# Patient Record
Sex: Female | Born: 1985 | Race: Black or African American | Hispanic: No | Marital: Married | Smoking: Never smoker
Health system: Southern US, Community
[De-identification: ages and names within clinical notes are randomized; demographics above are authoritative.]

## PROBLEM LIST (undated history)

## (undated) DIAGNOSIS — R87629 Unspecified abnormal cytological findings in specimens from vagina: Secondary | ICD-10-CM

## (undated) DIAGNOSIS — Z8759 Personal history of other complications of pregnancy, childbirth and the puerperium: Secondary | ICD-10-CM

## (undated) DIAGNOSIS — Z789 Other specified health status: Secondary | ICD-10-CM

## (undated) DIAGNOSIS — O139 Gestational [pregnancy-induced] hypertension without significant proteinuria, unspecified trimester: Secondary | ICD-10-CM

## (undated) DIAGNOSIS — I10 Essential (primary) hypertension: Secondary | ICD-10-CM

## (undated) HISTORY — DX: Essential (primary) hypertension: I10

## (undated) HISTORY — PX: TONSILLECTOMY: SUR1361

## (undated) HISTORY — DX: Personal history of other complications of pregnancy, childbirth and the puerperium: Z87.59

## (undated) HISTORY — DX: Gestational (pregnancy-induced) hypertension without significant proteinuria, unspecified trimester: O13.9

---
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
## 2014-03-23 DIAGNOSIS — Z720 Tobacco use: Secondary | ICD-10-CM | POA: Diagnosis present

## 2018-09-28 ENCOUNTER — Other Ambulatory Visit (HOSPITAL_COMMUNITY): Payer: Self-pay | Admitting: Nurse Practitioner

## 2018-09-28 DIAGNOSIS — Z3A21 21 weeks gestation of pregnancy: Secondary | ICD-10-CM

## 2018-09-28 DIAGNOSIS — Z3689 Encounter for other specified antenatal screening: Secondary | ICD-10-CM

## 2018-09-28 DIAGNOSIS — Z68.41 Body mass index (BMI) pediatric, 5th percentile to less than 85th percentile for age: Secondary | ICD-10-CM

## 2018-09-28 LAB — OB RESULTS CONSOLE GC/CHLAMYDIA
CHLAMYDIA, DNA PROBE: NEGATIVE
Gonorrhea: NEGATIVE

## 2018-09-28 LAB — OB RESULTS CONSOLE ABO/RH: RH Type: POSITIVE

## 2018-09-28 LAB — OB RESULTS CONSOLE RPR: RPR: NONREACTIVE

## 2018-09-28 LAB — OB RESULTS CONSOLE HGB/HCT, BLOOD
HEMATOCRIT: 37 (ref 29–41)
HEMOGLOBIN: 12

## 2018-09-28 LAB — AMB REFERRAL TO OB-GYN
A1C: 5.4
Pap: NEGATIVE
Urine Culture, OB: NEGATIVE

## 2018-09-28 LAB — OB RESULTS CONSOLE VARICELLA ZOSTER ANTIBODY, IGG: Varicella: NON-IMMUNE/NOT IMMUNE

## 2018-09-28 LAB — OB RESULTS CONSOLE PLATELET COUNT: PLATELETS: 211

## 2018-09-28 LAB — OB RESULTS CONSOLE ANTIBODY SCREEN: Antibody Screen: NEGATIVE

## 2018-09-28 LAB — OB RESULTS CONSOLE HEPATITIS B SURFACE ANTIGEN: Hepatitis B Surface Ag: NEGATIVE

## 2018-09-29 ENCOUNTER — Encounter (HOSPITAL_COMMUNITY): Payer: Self-pay | Admitting: *Deleted

## 2018-10-03 ENCOUNTER — Encounter (HOSPITAL_COMMUNITY): Payer: Self-pay

## 2018-10-03 ENCOUNTER — Other Ambulatory Visit: Payer: Self-pay

## 2018-10-03 ENCOUNTER — Ambulatory Visit (HOSPITAL_COMMUNITY)
Admission: RE | Admit: 2018-10-03 | Discharge: 2018-10-03 | Disposition: A | Discharge status: Home / Self Care | Payer: Medicaid Other | Source: Ambulatory Visit | Attending: Nurse Practitioner | Admitting: Nurse Practitioner

## 2018-10-03 DIAGNOSIS — O0932 Supervision of pregnancy with insufficient antenatal care, second trimester: Secondary | ICD-10-CM

## 2018-10-03 DIAGNOSIS — Z36 Encounter for antenatal screening for chromosomal anomalies: Secondary | ICD-10-CM | POA: Insufficient documentation

## 2018-10-03 DIAGNOSIS — Z68.41 Body mass index (BMI) pediatric, 5th percentile to less than 85th percentile for age: Secondary | ICD-10-CM

## 2018-10-03 DIAGNOSIS — O99332 Smoking (tobacco) complicating pregnancy, second trimester: Secondary | ICD-10-CM | POA: Diagnosis not present

## 2018-10-03 DIAGNOSIS — O09213 Supervision of pregnancy with history of pre-term labor, third trimester: Secondary | ICD-10-CM

## 2018-10-03 DIAGNOSIS — Z363 Encounter for antenatal screening for malformations: Secondary | ICD-10-CM

## 2018-10-03 DIAGNOSIS — Z3A21 21 weeks gestation of pregnancy: Secondary | ICD-10-CM | POA: Insufficient documentation

## 2018-10-03 DIAGNOSIS — Z3689 Encounter for other specified antenatal screening: Secondary | ICD-10-CM

## 2018-10-03 HISTORY — DX: Other specified health status: Z78.9

## 2018-10-04 ENCOUNTER — Other Ambulatory Visit (HOSPITAL_COMMUNITY): Payer: Self-pay | Admitting: *Deleted

## 2018-10-04 DIAGNOSIS — O132 Gestational [pregnancy-induced] hypertension without significant proteinuria, second trimester: Secondary | ICD-10-CM

## 2018-10-06 ENCOUNTER — Encounter: Payer: Self-pay | Admitting: *Deleted

## 2018-10-10 ENCOUNTER — Encounter: Payer: Self-pay | Admitting: Obstetrics and Gynecology

## 2018-10-10 NOTE — BH Specialist Note (Deleted)
Integrated Behavioral Health Initial Visit  MRN: [id number] Name: Jill Mullen  Number of Integrated Behavioral Health Clinician visits:: 1/6 Session Start time: ***  Session End time: *** Total time: {IBH Total Time:21014050}  Type of Service: Integrated Behavioral Health- Individual/Family Interpretor:No. Interpretor Name and Language: n/a   Warm Hand Off Completed.       SUBJECTIVE: Jill Mullen is a 32 y.o. female accompanied by {CHL AMB ACCOMPANIED WU:9811914782} Patient was referred by Catalina Antigua, MD for Initial OB introduction to integrated behavioral health services . Patient reports the following symptoms/concerns: *** Duration of problem: ***; Severity of problem: {Mild/Moderate/Severe:20260}  OBJECTIVE: Mood: {BHH MOOD:22306} and Affect: {BHH AFFECT:22307} Risk of harm to self or others: {CHL AMB BH Suicide Current Mental Status:21022748}  LIFE CONTEXT: Family and Social: *** School/Work: *** Self-Care: *** Life Changes: Current pregnancy ***  GOALS ADDRESSED: Patient will: 1. Reduce symptoms of: {IBH Symptoms:21014056} 2. Increase knowledge and/or ability of: {IBH Patient Tools:21014057}  3. Demonstrate ability to: {IBH Goals:21014053}  INTERVENTIONS: Interventions utilized: {IBH Interventions:21014054}  Standardized Assessments completed: GAD-7 and PHQ 9  ASSESSMENT: Patient currently experiencing Supervision of *** pregnancy ***.   Patient may benefit from Initial OB introduction to integrated behavioral health services .  PLAN: 1. Follow up with behavioral health clinician on : *** 2. Behavioral recommendations: *** 3. Referral(s): {IBH Referrals:21014055} 4. "From scale of 1-10, how likely are you to follow plan?": ***  Valetta Close Sharese Manrique, LCSW  ***

## 2018-10-10 NOTE — Progress Notes (Signed)
Patient came in late for her appt, she was scheduled for 8:35 and she was over 20 mins late so I got her rescheduled for a new appt she said that day would not work for her and she got upset and walked out

## 2018-10-11 LAB — AMB REFERRAL TO OB-GYN: Drug Screen, Urine: NEGATIVE

## 2018-10-11 NOTE — Progress Notes (Signed)
Pt Varicella Titer showed Non-Immune on 09/28/18, need vaccine Postpartum.

## 2018-10-12 ENCOUNTER — Encounter: Payer: Medicaid Other | Admitting: Obstetrics & Gynecology

## 2018-10-13 ENCOUNTER — Encounter: Payer: Self-pay | Admitting: Obstetrics & Gynecology

## 2018-10-13 ENCOUNTER — Ambulatory Visit (INDEPENDENT_AMBULATORY_CARE_PROVIDER_SITE_OTHER): Payer: Medicaid Other | Admitting: Obstetrics & Gynecology

## 2018-10-13 ENCOUNTER — Ambulatory Visit: Payer: Self-pay | Admitting: Clinical

## 2018-10-13 DIAGNOSIS — O0992 Supervision of high risk pregnancy, unspecified, second trimester: Secondary | ICD-10-CM

## 2018-10-13 DIAGNOSIS — O09212 Supervision of pregnancy with history of pre-term labor, second trimester: Secondary | ICD-10-CM

## 2018-10-13 DIAGNOSIS — O0990 Supervision of high risk pregnancy, unspecified, unspecified trimester: Secondary | ICD-10-CM

## 2018-10-13 DIAGNOSIS — O09219 Supervision of pregnancy with history of pre-term labor, unspecified trimester: Secondary | ICD-10-CM | POA: Insufficient documentation

## 2018-10-13 NOTE — Patient Instructions (Signed)
Second Trimester of Pregnancy The second trimester is from week 13 through week 28, month 4 through 6. This is often the time in pregnancy that you feel your best. Often times, morning sickness has lessened or quit. You may have more energy, and you may get hungry more often. Your unborn baby (fetus) is growing rapidly. At the end of the sixth month, he or she is about 9 inches long and weighs about 1 pounds. You will likely feel the baby move (quickening) between 18 and 20 weeks of pregnancy. Follow these instructions at home:  Avoid all smoking, herbs, and alcohol. Avoid drugs not approved by your doctor.  Do not use any tobacco products, including cigarettes, chewing tobacco, and electronic cigarettes. If you need help quitting, ask your doctor. You may get counseling or other support to help you quit.  Only take medicine as told by your doctor. Some medicines are safe and some are not during pregnancy.  Exercise only as told by your doctor. Stop exercising if you start having cramps.  Eat regular, healthy meals.  Wear a good support bra if your breasts are tender.  Do not use hot tubs, steam rooms, or saunas.  Wear your seat belt when driving.  Avoid raw meat, uncooked cheese, and liter boxes and soil used by cats.  Take your prenatal vitamins.  Take 1500-2000 milligrams of calcium daily starting at the 20th week of pregnancy until you deliver your baby.  Try taking medicine that helps you poop (stool softener) as needed, and if your doctor approves. Eat more fiber by eating fresh fruit, vegetables, and whole grains. Drink enough fluids to keep your pee (urine) clear or pale yellow.  Take warm water baths (sitz baths) to soothe pain or discomfort caused by hemorrhoids. Use hemorrhoid cream if your doctor approves.  If you have puffy, bulging veins (varicose veins), wear support hose. Raise (elevate) your feet for 15 minutes, 3-4 times a day. Limit salt in your diet.  Avoid heavy  lifting, wear low heals, and sit up straight.  Rest with your legs raised if you have leg cramps or low back pain.  Visit your dentist if you have not gone during your pregnancy. Use a soft toothbrush to brush your teeth. Be gentle when you floss.  You can have sex (intercourse) unless your doctor tells you not to.  Go to your doctor visits. Get help if:  You feel dizzy.  You have mild cramps or pressure in your lower belly (abdomen).  You have a nagging pain in your belly area.  You continue to feel sick to your stomach (nauseous), throw up (vomit), or have watery poop (diarrhea).  You have bad smelling fluid coming from your vagina.  You have pain with peeing (urination). Get help right away if:  You have a fever.  You are leaking fluid from your vagina.  You have spotting or bleeding from your vagina.  You have severe belly cramping or pain.  You lose or gain weight rapidly.  You have trouble catching your breath and have chest pain.  You notice sudden or extreme puffiness (swelling) of your face, hands, ankles, feet, or legs.  You have not felt the baby move in over an hour.  You have severe headaches that do not go away with medicine.  You have vision changes. This information is not intended to replace advice given to you by your health care provider. Make sure you discuss any questions you have with your health care   provider. Document Released: 02/16/2010 Document Revised: 04/29/2016 Document Reviewed: 01/23/2013 Elsevier Interactive Patient Education  2017 [employer]  

## 2018-10-13 NOTE — Progress Notes (Signed)
  Subjective:    Jill Mullen is a [id number] [redacted]w[redacted]d being seen today for her first obstetrical visit.  Her obstetrical history is significant for pregnancy induced hypertension and preterm labor. Patient does intend to breast feed. Pregnancy history fully reviewed.  Patient reports no complaints.  Vitals:   10/13/18 1047  BP: 132/74  Pulse: 90  Weight: (!) 136.7 kg    HISTORY: OB History  Gravida Para Term Preterm AB Living  4 2 1 1 1 2   SAB TAB Ectopic Multiple Live Births  1       2    # Outcome Date GA Lbr Len/2nd Weight Sex Delivery Anes PTL Lv  4 Current           3 Preterm 11/03/11 [redacted]w[redacted]d  2.835 kg M Vag-Spont Other  LIV  2 Term 03/02/07 [redacted]w[redacted]d  3.289 kg F Vag-Spont Other  LIV  1 SAB            Past Medical History:  Diagnosis Date  . Hypertension   . Medical history non-contributory   . Pregnancy induced hypertension    Past Surgical History:  Procedure Laterality Date  . TONSILLECTOMY     History reviewed. No pertinent family history.   Exam    Uterus:  Fundal Height: 21 cm  Pelvic Exam:                               System: Breast:  deferred   Skin: normal coloration and turgor, no rashes    Neurologic: oriented, normal mood   Extremities: normal strength, tone, and muscle mass   HEENT PERRLA, extra ocular movement intact and sclera clear, anicteric   Mouth/Teeth mucous membranes moist, pharynx normal without lesions   Neck supple   Cardiovascular: regular rate and rhythm   Respiratory:  appears well, vitals normal, no respiratory distress, acyanotic, normal RR   Abdomen: soft, non-tender; bowel sounds normal; no masses,  no organomegaly   Urinary:        Assessment:    Pregnancy: [id number] Patient Active Problem List   Diagnosis Date Noted  . Supervision of high risk pregnancy, antepartum 10/13/2018        Plan:     Initial labs drawn. Prenatal vitamins. Problem list reviewed and updated. Genetic Screening discussed late to  care  Ultrasound discussed; fetal survey: results reviewed.  Follow up in 4 weeks. 50% of 30 min visit spent on counseling and coordination of care.  2 hr GTT next  Makena not recommended due to GA ASA daily  Scheryl Darter 10/13/2018

## 2018-10-13 NOTE — BH Specialist Note (Signed)
Integrated Behavioral Health Initial Visit  MRN: [id number] Name: Cate Oravec  Number of Integrated Behavioral Health Clinician visits:: 1/6 Session Start time: 10:25  Session End time: 10:40 Total time: 15 minutes  Type of Service: Integrated Behavioral Health- Individual/Family Interpretor:No. Interpretor Name and Language: n/a   Warm Hand Off Completed.       SUBJECTIVE: Liyanna Cartwright is a 32 y.o. female accompanied by Partner/Significant Other Patient was referred by Scheryl Darter, MD for Initial OB introduction to integrated behavioral health services . Patient reports the following symptoms/concerns: Pt states she is unfamiliar with the hospital and local area; no other concerns.  Duration of problem: Current pregnancy; Severity of problem: mild  OBJECTIVE: Mood: Normal and Affect: Appropriate Risk of harm to self or others: No plan to harm self or others  LIFE CONTEXT: Family and Social: Pt lives with FOB and two children School/Work: - Self-Care: - Life Changes: Current pregnancy; move to [employer]   GOALS ADDRESSED: Patient will: 1. Increase knowledge and/or ability of: healthy habits   INTERVENTIONS: Interventions utilized: Supportive Counseling and Link to [facility]  Standardized Assessments completed: GAD-7 and PHQ 9  ASSESSMENT: Patient currently experiencing Supervision of high risk pregnancy, antepartum.   Patient may benefit from Initial OB introduction to integrated behavioral health services .  PLAN: 1. Follow up with behavioral health clinician on : As needed 2. Behavioral recommendations:  -Continue taking prenatal vitamin, as recommended by medical provider 3. Referral(s): Integrated Art gallery manager (In Clinic) and Community Resources:  Local resouces 4. "From scale of 1-10, how likely are you to follow plan?": 10  Rae Lips, LCSW  Depression screen [facility] 2/9 10/13/2018  Decreased Interest 0  Down,  Depressed, Hopeless 0  PHQ - 2 Score 0  Altered sleeping 0  Tired, decreased energy 1  Change in appetite 0  Feeling bad or failure about yourself  0  Trouble concentrating 0  Moving slowly or fidgety/restless 0  Suicidal thoughts 0  PHQ-9 Score 1   GAD 7 : Generalized Anxiety Score 10/13/2018  Nervous, Anxious, on Edge 0  Control/stop worrying 0  Worry too much - different things 0  Trouble relaxing 0  Restless 0  Easily annoyed or irritable 0  Afraid - awful might happen 0  Total GAD 7 Score 0

## 2018-10-31 ENCOUNTER — Other Ambulatory Visit (HOSPITAL_COMMUNITY): Payer: Self-pay | Admitting: *Deleted

## 2018-10-31 ENCOUNTER — Ambulatory Visit (HOSPITAL_COMMUNITY)
Admission: RE | Admit: 2018-10-31 | Discharge: 2018-10-31 | Disposition: A | Discharge status: Home / Self Care | Payer: Medicaid Other | Source: Ambulatory Visit | Attending: Nurse Practitioner | Admitting: Nurse Practitioner

## 2018-10-31 ENCOUNTER — Encounter (HOSPITAL_COMMUNITY): Payer: Self-pay

## 2018-10-31 DIAGNOSIS — Z363 Encounter for antenatal screening for malformations: Secondary | ICD-10-CM

## 2018-10-31 DIAGNOSIS — O99212 Obesity complicating pregnancy, second trimester: Secondary | ICD-10-CM | POA: Diagnosis not present

## 2018-10-31 DIAGNOSIS — O99332 Smoking (tobacco) complicating pregnancy, second trimester: Secondary | ICD-10-CM

## 2018-10-31 DIAGNOSIS — O0932 Supervision of pregnancy with insufficient antenatal care, second trimester: Secondary | ICD-10-CM

## 2018-10-31 DIAGNOSIS — O99210 Obesity complicating pregnancy, unspecified trimester: Secondary | ICD-10-CM

## 2018-10-31 DIAGNOSIS — O132 Gestational [pregnancy-induced] hypertension without significant proteinuria, second trimester: Secondary | ICD-10-CM | POA: Diagnosis present

## 2018-10-31 DIAGNOSIS — O09292 Supervision of pregnancy with other poor reproductive or obstetric history, second trimester: Secondary | ICD-10-CM | POA: Diagnosis not present

## 2018-10-31 DIAGNOSIS — Z3A25 25 weeks gestation of pregnancy: Secondary | ICD-10-CM | POA: Diagnosis not present

## 2018-10-31 DIAGNOSIS — O09212 Supervision of pregnancy with history of pre-term labor, second trimester: Secondary | ICD-10-CM

## 2018-11-06 ENCOUNTER — Other Ambulatory Visit: Payer: Self-pay | Admitting: General Practice

## 2018-11-06 DIAGNOSIS — O09219 Supervision of pregnancy with history of pre-term labor, unspecified trimester: Secondary | ICD-10-CM

## 2018-11-10 ENCOUNTER — Other Ambulatory Visit: Payer: Self-pay

## 2018-11-10 ENCOUNTER — Encounter: Payer: Self-pay | Admitting: Obstetrics & Gynecology

## 2018-11-15 ENCOUNTER — Encounter: Payer: Self-pay | Admitting: Obstetrics and Gynecology

## 2018-11-15 ENCOUNTER — Other Ambulatory Visit: Payer: Self-pay

## 2018-11-16 ENCOUNTER — Ambulatory Visit (INDEPENDENT_AMBULATORY_CARE_PROVIDER_SITE_OTHER): Payer: Medicaid Other | Admitting: Obstetrics & Gynecology

## 2018-11-16 ENCOUNTER — Encounter: Payer: Self-pay | Admitting: Obstetrics & Gynecology

## 2018-11-16 DIAGNOSIS — O0992 Supervision of high risk pregnancy, unspecified, second trimester: Secondary | ICD-10-CM

## 2018-11-16 DIAGNOSIS — O0990 Supervision of high risk pregnancy, unspecified, unspecified trimester: Secondary | ICD-10-CM

## 2018-11-16 MED ORDER — COMFORT FIT MATERNITY SUPP LG MISC: 1.0000 [IU] | Freq: Every day | 0 refills | Status: DC

## 2018-11-16 MED ORDER — COMFORT FIT MATERNITY SUPP LG MISC: 1.0000 | 0 refills | Status: DC | PRN

## 2018-11-16 NOTE — Patient Instructions (Signed)

## 2018-11-16 NOTE — Progress Notes (Signed)
   PRENATAL VISIT NOTE  Subjective:  Jill Mullen is a 32 y.o. [id number] at [redacted]w[redacted]d being seen today for ongoing prenatal care.  She is currently monitored for the following issues for this high-risk pregnancy and has Supervision of high risk pregnancy, antepartum and Previous preterm delivery, antepartum on their problem list.  Patient reports no complaints.  Contractions: Not present. Vag. Bleeding: None.  Movement: Present. Denies leaking of fluid.   The following portions of the patient's history were reviewed and updated as appropriate: allergies, current medications, past family history, past medical history, past social history, past surgical history and problem list. Problem list updated.  Objective:   Vitals:   11/16/18 1423  BP: 131/84  Pulse: (!) 105  Weight: (!) 308 lb 9.6 oz (140 kg)    Fetal Status: Fetal Heart Rate (bpm): 143 Fundal Height: 27 cm Movement: Present     General:  Alert, oriented and cooperative. Patient is in no acute distress.  Skin: Skin is warm and dry. No rash noted.   Cardiovascular: Normal heart rate noted  Respiratory: Normal respiratory effort, no problems with respiration noted  Abdomen: Soft, gravid, appropriate for gestational age.  Pain/Pressure: Present     Pelvic: Cervical exam deferred        Extremities: Normal range of motion.  Edema: Trace  Mental Status: Normal mood and affect. Normal behavior. Normal judgment and thought content.   Assessment and Plan:  Pregnancy: [id number] at 311w3d  1. Supervision of high risk pregnancy, antepartum ordered - Elastic Bandages & Supports (COMFORT FIT MATERNITY SUPP LG) MISC; 1 Units by Does not apply route daily.  Dispense: 1 each; Refill: 0  Preterm labor symptoms and general obstetric precautions including but not limited to vaginal bleeding, contractions, leaking of fluid and fetal movement were reviewed in detail with the patient. Please refer to After Visit Summary for other counseling  recommendations.  Return in about 2 weeks (around 11/30/2018) for 2 hr and complete prenatal panel.  Future Appointments  Date Time Provider Department Center  12/26/2018  9:30 AM [facility] US 1 [facility]    Scheryl DarterJames Cyd Hostler, MD

## 2018-12-01 ENCOUNTER — Other Ambulatory Visit: Payer: Self-pay

## 2018-12-01 ENCOUNTER — Encounter: Payer: Self-pay | Admitting: Obstetrics and Gynecology

## 2018-12-04 NOTE — Progress Notes (Signed)
Patient did not keep her OB appointment for 12/01/2018.  Alayja Armas, Jr MD Attending [facility] (Faculty Practice)   

## 2018-12-06 NOTE — L&D Delivery Note (Signed)
[facility]N Faculty Practice Delivery Note  Jill Mullen is a 33 y.o. [id number] s/p SVD at [redacted]w[redacted]d. She was admitted for IOL for oligohydramnios.   ROM: 6h 38m with clear fluid GBS Status: positive Maximum Maternal Temperature: 98.3  Labor Progress: . Cervical ripening with cytotec x5 doses followed by SROM at 2056 with active labor to follow.   Delivery Date/Time: 01/25/2019 @ 0327 Delivery: Called to room and patient was complete and pushing. Head delivered LOA. Nuchal cord present x1 reduced prior to delivery. Shoulder and body delivered in usual fashion. Infant with spontaneous cry, placed on mother's abdomen, dried and stimulated. Cord clamped x 2 after 1-minute delay, and cut by father of the baby. Cord blood drawn. Placenta delivered spontaneously with gentle cord traction. Fundus firm with massage and Pitocin. Labia, perineum, vagina, and cervix inspected inspected with no lacerations.   Placenta: spontaneous, intact Complications: none Lacerations: none EBL: 125cc Analgesia: none  Postpartum Planning [x]  message to sent to schedule follow-up   Infant: Boy  APGARs 8 at 1 min and 8 at 5 min  weight pending   Camelia Eng, SNM [phone]

## 2018-12-07 ENCOUNTER — Telehealth: Payer: Self-pay | Admitting: Family Medicine

## 2018-12-07 NOTE — Telephone Encounter (Signed)
Patient called to re-schedule the appointments she missed. Also wanted to know if she could bring her kids with her.

## 2018-12-21 ENCOUNTER — Other Ambulatory Visit: Payer: Medicaid Other

## 2018-12-21 ENCOUNTER — Encounter: Payer: Self-pay | Admitting: Obstetrics and Gynecology

## 2018-12-21 ENCOUNTER — Other Ambulatory Visit: Payer: Self-pay

## 2018-12-21 ENCOUNTER — Other Ambulatory Visit (HOSPITAL_COMMUNITY)
Admission: RE | Admit: 2018-12-21 | Discharge: 2018-12-21 | Disposition: A | Discharge status: Home / Self Care | Payer: Medicaid Other | Source: Ambulatory Visit | Attending: Obstetrics and Gynecology | Admitting: Obstetrics and Gynecology

## 2018-12-21 ENCOUNTER — Ambulatory Visit (INDEPENDENT_AMBULATORY_CARE_PROVIDER_SITE_OTHER): Payer: Medicaid Other | Admitting: Obstetrics and Gynecology

## 2018-12-21 VITALS — BP 133/84 | HR 103 | Wt 311.4 lb

## 2018-12-21 DIAGNOSIS — O09213 Supervision of pregnancy with history of pre-term labor, third trimester: Secondary | ICD-10-CM

## 2018-12-21 DIAGNOSIS — Z3A32 32 weeks gestation of pregnancy: Secondary | ICD-10-CM

## 2018-12-21 DIAGNOSIS — O0993 Supervision of high risk pregnancy, unspecified, third trimester: Secondary | ICD-10-CM

## 2018-12-21 DIAGNOSIS — N898 Other specified noninflammatory disorders of vagina: Secondary | ICD-10-CM

## 2018-12-21 DIAGNOSIS — O09219 Supervision of pregnancy with history of pre-term labor, unspecified trimester: Secondary | ICD-10-CM

## 2018-12-21 DIAGNOSIS — O0990 Supervision of high risk pregnancy, unspecified, unspecified trimester: Secondary | ICD-10-CM

## 2018-12-21 NOTE — Addendum Note (Signed)
Addended by: Henrietta Dine on: 12/21/2018 02:25 PM   Modules accepted: Orders

## 2018-12-21 NOTE — Progress Notes (Signed)
Subjective:  Jill Mullen is a 33 y.o. [id number] at [redacted]w[redacted]d being seen today for ongoing prenatal care.  She is currently monitored for the following issues for this high-risk pregnancy and has Supervision of high risk pregnancy, antepartum and Previous preterm delivery, antepartum on their problem list.  Patient reports vaginal discharge.  Contractions: Irritability. Vag. Bleeding: None.  Movement: Present. Denies leaking of fluid.   The following portions of the patient's history were reviewed and updated as appropriate: allergies, current medications, past family history, past medical history, past social history, past surgical history and problem list. Problem list updated.  Objective:   Vitals:   12/21/18 1340  BP: 133/84  Pulse: (!) 103  Weight: (!) 311 lb 6.4 oz (141.3 kg)    Fetal Status: Fetal Heart Rate (bpm): 143   Movement: Present     General:  Alert, oriented and cooperative. Patient is in no acute distress.  Skin: Skin is warm and dry. No rash noted.   Cardiovascular: Normal heart rate noted  Respiratory: Normal respiratory effort, no problems with respiration noted  Abdomen: Soft, gravid, appropriate for gestational age. Pain/Pressure: Present     Pelvic:  Cervical exam deferred        Extremities: Normal range of motion.  Edema: Trace  Mental Status: Normal mood and affect. Normal behavior. Normal judgment and thought content.   Urinalysis:      Assessment and Plan:  Pregnancy: [id number] at [redacted]w[redacted]d  1. Supervision of high risk pregnancy, antepartum Stable Glucola today Declines Tdap  2. Previous preterm delivery, antepartum Stable No evidence presently  3. Discharge from the vagina Self swab today - Cervicovaginal ancillary only( [place])  Preterm labor symptoms and general obstetric precautions including but not limited to vaginal bleeding, contractions, leaking of fluid and fetal movement were reviewed in detail with the patient. Please refer to After  Visit Summary for other counseling recommendations.  Return in about 2 weeks (around 01/04/2019) for OB visit.   Hermina Staggers, MD

## 2018-12-21 NOTE — Patient Instructions (Signed)
Third Trimester of Pregnancy The third trimester is from week 28 through week 40 (months 7 through 9). The third trimester is a time when the unborn baby (fetus) is growing rapidly. At the end of the ninth month, the fetus is about 20 inches in length and weighs 6-10 pounds. Body changes during your third trimester Your body will continue to go through many changes during pregnancy. The changes vary from woman to woman. During the third trimester:  Your weight will continue to increase. You can expect to gain 25-35 pounds (11-16 kg) by the end of the pregnancy.  You may begin to get stretch marks on your hips, abdomen, and breasts.  You may urinate more often because the fetus is moving lower into your pelvis and pressing on your bladder.  You may develop or continue to have heartburn. This is caused by increased hormones that slow down muscles in the digestive tract.  You may develop or continue to have constipation because increased hormones slow digestion and cause the muscles that push waste through your intestines to relax.  You may develop hemorrhoids. These are swollen veins (varicose veins) in the rectum that can itch or be painful.  You may develop swollen, bulging veins (varicose veins) in your legs.  You may have increased body aches in the pelvis, back, or thighs. This is due to weight gain and increased hormones that are relaxing your joints.  You may have changes in your hair. These can include thickening of your hair, rapid growth, and changes in texture. Some women also have hair loss during or after pregnancy, or hair that feels dry or thin. Your hair will most likely return to normal after your baby is born.  Your breasts will continue to grow and they will continue to become tender. A yellow fluid (colostrum) may leak from your breasts. This is the first milk you are producing for your baby.  Your belly button may stick out.  You may notice more swelling in your hands,  face, or ankles.  You may have increased tingling or numbness in your hands, arms, and legs. The skin on your belly may also feel numb.  You may feel short of breath because of your expanding uterus.  You may have more problems sleeping. This can be caused by the size of your belly, increased need to urinate, and an increase in your body's metabolism.  You may notice the fetus "dropping," or moving lower in your abdomen (lightening).  You may have increased vaginal discharge.  You may notice your joints feel loose and you may have pain around your pelvic bone. What to expect at prenatal visits You will have prenatal exams every 2 weeks until week 36. Then you will have weekly prenatal exams. During a routine prenatal visit:  You will be weighed to make sure you and the baby are growing normally.  Your blood pressure will be taken.  Your abdomen will be measured to track your baby's growth.  The fetal heartbeat will be listened to.  Any test results from the previous visit will be discussed.  You may have a cervical check near your due date to see if your cervix has softened or thinned (effaced).  You will be tested for Group B streptococcus. This happens between 35 and 37 weeks. Your health care provider may ask you:  What your birth plan is.  How you are feeling.  If you are feeling the baby move.  If you have had any abnormal   symptoms, such as leaking fluid, bleeding, severe headaches, or abdominal cramping.  If you are using any tobacco products, including cigarettes, chewing tobacco, and electronic cigarettes.  If you have any questions. Other tests or screenings that may be performed during your third trimester include:  Blood tests that check for low iron levels (anemia).  Fetal testing to check the health, activity level, and growth of the fetus. Testing is done if you have certain medical conditions or if there are problems during the pregnancy.  Nonstress test  (NST). This test checks the health of your baby to make sure there are no signs of problems, such as the baby not getting enough oxygen. During this test, a belt is placed around your belly. The baby is made to move, and its heart rate is monitored during movement. What is false labor? False labor is a condition in which you feel small, irregular tightenings of the muscles in the womb (contractions) that usually go away with rest, changing position, or drinking water. These are called Braxton Hicks contractions. Contractions may last for hours, days, or even weeks before true labor sets in. If contractions come at regular intervals, become more frequent, increase in intensity, or become painful, you should see your health care provider. What are the signs of labor?  Abdominal cramps.  Regular contractions that start at 10 minutes apart and become stronger and more frequent with time.  Contractions that start on the top of the uterus and spread down to the lower abdomen and back.  Increased pelvic pressure and dull back pain.  A watery or bloody mucus discharge that comes from the vagina.  Leaking of amniotic fluid. This is also known as your "water breaking." It could be a slow trickle or a gush. Let your health care provider know if it has a color or strange odor. If you have any of these signs, call your health care provider right away, even if it is before your due date. Follow these instructions at home: Medicines  Follow your health care provider's instructions regarding medicine use. Specific medicines may be either safe or unsafe to take during pregnancy.  Take a prenatal vitamin that contains at least 600 micrograms (mcg) of folic acid.  If you develop constipation, try taking a stool softener if your health care provider approves. Eating and drinking   Eat a balanced diet that includes fresh fruits and vegetables, whole grains, good sources of protein such as meat, eggs, or tofu,  and low-fat dairy. Your health care provider will help you determine the amount of weight gain that is right for you.  Avoid raw meat and uncooked cheese. These carry germs that can cause birth defects in the baby.  If you have low calcium intake from food, talk to your health care provider about whether you should take a daily calcium supplement.  Eat four or five small meals rather than three large meals a day.  Limit foods that are high in fat and processed sugars, such as fried and sweet foods.  To prevent constipation: ? Drink enough fluid to keep your urine clear or pale yellow. ? Eat foods that are high in fiber, such as fresh fruits and vegetables, whole grains, and beans. Activity  Exercise only as directed by your health care provider. Most women can continue their usual exercise routine during pregnancy. Try to exercise for 30 minutes at least 5 days a week. Stop exercising if you experience uterine contractions.  Avoid heavy lifting.  Do   not exercise in extreme heat or humidity, or at high altitudes.  Wear low-heel, comfortable shoes.  Practice good posture.  You may continue to have sex unless your health care provider tells you otherwise. Relieving pain and discomfort  Take frequent breaks and rest with your legs elevated if you have leg cramps or low back pain.  Take warm sitz baths to soothe any pain or discomfort caused by hemorrhoids. Use hemorrhoid cream if your health care provider approves.  Wear a good support bra to prevent discomfort from breast tenderness.  If you develop varicose veins: ? Wear support pantyhose or compression stockings as told by your healthcare provider. ? Elevate your feet for 15 minutes, 3-4 times a day. Prenatal care  Write down your questions. Take them to your prenatal visits.  Keep all your prenatal visits as told by your health care provider. This is important. Safety  Wear your seat belt at all times when driving.  Make  a list of emergency phone numbers, including numbers for family, friends, the hospital, and police and fire departments. General instructions  Avoid cat litter boxes and soil used by cats. These carry germs that can cause birth defects in the baby. If you have a cat, ask someone to clean the litter box for you.  Do not travel far distances unless it is absolutely necessary and only with the approval of your health care provider.  Do not use hot tubs, steam rooms, or saunas.  Do not drink alcohol.  Do not use any products that contain nicotine or tobacco, such as cigarettes and e-cigarettes. If you need help quitting, ask your health care provider.  Do not use any medicinal herbs or unprescribed drugs. These chemicals affect the formation and growth of the baby.  Do not douche or use tampons or scented sanitary pads.  Do not cross your legs for long periods of time.  To prepare for the arrival of your baby: ? Take prenatal classes to understand, practice, and ask questions about labor and delivery. ? Make a trial run to the hospital. ? Visit the hospital and tour the maternity area. ? Arrange for maternity or paternity leave through employers. ? Arrange for family and friends to take care of pets while you are in the hospital. ? Purchase a rear-facing car seat and make sure you know how to install it in your car. ? Pack your hospital bag. ? Prepare the baby's nursery. Make sure to remove all pillows and stuffed animals from the baby's crib to prevent suffocation.  Visit your dentist if you have not gone during your pregnancy. Use a soft toothbrush to brush your teeth and be gentle when you floss. Contact a health care provider if:  You are unsure if you are in labor or if your water has broken.  You become dizzy.  You have mild pelvic cramps, pelvic pressure, or nagging pain in your abdominal area.  You have lower back pain.  You have persistent nausea, vomiting, or  diarrhea.  You have an unusual or bad smelling vaginal discharge.  You have pain when you urinate. Get help right away if:  Your water breaks before 37 weeks.  You have regular contractions less than 5 minutes apart before 37 weeks.  You have a fever.  You are leaking fluid from your vagina.  You have spotting or bleeding from your vagina.  You have severe abdominal pain or cramping.  You have rapid weight loss or weight gain.  You have   shortness of breath with chest pain.  You notice sudden or extreme swelling of your face, hands, ankles, feet, or legs.  Your baby makes fewer than 10 movements in 2 hours.  You have severe headaches that do not go away when you take medicine.  You have vision changes. Summary  The third trimester is from week 28 through week 40, months 7 through 9. The third trimester is a time when the unborn baby (fetus) is growing rapidly.  During the third trimester, your discomfort may increase as you and your baby continue to gain weight. You may have abdominal, leg, and back pain, sleeping problems, and an increased need to urinate.  During the third trimester your breasts will keep growing and they will continue to become tender. A yellow fluid (colostrum) may leak from your breasts. This is the first milk you are producing for your baby.  False labor is a condition in which you feel small, irregular tightenings of the muscles in the womb (contractions) that eventually go away. These are called Braxton Hicks contractions. Contractions may last for hours, days, or even weeks before true labor sets in.  Signs of labor can include: abdominal cramps; regular contractions that start at 10 minutes apart and become stronger and more frequent with time; watery or bloody mucus discharge that comes from the vagina; increased pelvic pressure and dull back pain; and leaking of amniotic fluid. This information is not intended to replace advice given to you by your  health care provider. Make sure you discuss any questions you have with your health care provider. Document Released: 11/16/2001 Document Revised: 12/28/2016 Document Reviewed: 12/28/2016 Elsevier Interactive Patient Education  2019 Elsevier Inc.  

## 2018-12-22 LAB — CERVICOVAGINAL ANCILLARY ONLY
BACTERIAL VAGINITIS: NEGATIVE
Candida vaginitis: POSITIVE — AB
Chlamydia: NEGATIVE
NEISSERIA GONORRHEA: NEGATIVE
Trichomonas: NEGATIVE

## 2018-12-22 LAB — CBC
Hematocrit: 33.4 % — ABNORMAL LOW (ref 34.0–46.6)
Hemoglobin: 11.5 g/dL (ref 11.1–15.9)
MCH: 31.2 pg (ref 26.6–33.0)
MCHC: 34.4 g/dL (ref 31.5–35.7)
MCV: 91 fL (ref 79–97)
Platelets: 197 10*3/uL (ref 150–450)
RBC: 3.69 x10E6/uL — ABNORMAL LOW (ref 3.77–5.28)
RDW: 13.3 % (ref 11.7–15.4)
WBC: 6.9 10*3/uL (ref 3.4–10.8)

## 2018-12-22 LAB — RPR: RPR: NONREACTIVE

## 2018-12-22 LAB — GLUCOSE TOLERANCE, 1 HOUR: Glucose, 1Hr PP: 105 mg/dL (ref 65–199)

## 2018-12-22 LAB — HIV ANTIBODY (ROUTINE TESTING W REFLEX): HIV SCREEN 4TH GENERATION: NONREACTIVE

## 2018-12-26 ENCOUNTER — Telehealth: Payer: Self-pay | Admitting: *Deleted

## 2018-12-26 ENCOUNTER — Encounter (HOSPITAL_COMMUNITY): Payer: Self-pay

## 2018-12-26 ENCOUNTER — Other Ambulatory Visit (HOSPITAL_COMMUNITY): Payer: Self-pay | Admitting: *Deleted

## 2018-12-26 ENCOUNTER — Ambulatory Visit (HOSPITAL_COMMUNITY)
Admission: RE | Admit: 2018-12-26 | Discharge: 2018-12-26 | Disposition: A | Discharge status: Home / Self Care | Payer: Medicaid Other | Source: Ambulatory Visit | Attending: Obstetrics & Gynecology | Admitting: Obstetrics & Gynecology

## 2018-12-26 DIAGNOSIS — O99210 Obesity complicating pregnancy, unspecified trimester: Secondary | ICD-10-CM

## 2018-12-26 DIAGNOSIS — Z3A33 33 weeks gestation of pregnancy: Secondary | ICD-10-CM

## 2018-12-26 DIAGNOSIS — O09213 Supervision of pregnancy with history of pre-term labor, third trimester: Secondary | ICD-10-CM | POA: Diagnosis not present

## 2018-12-26 DIAGNOSIS — Z363 Encounter for antenatal screening for malformations: Secondary | ICD-10-CM

## 2018-12-26 DIAGNOSIS — O99333 Smoking (tobacco) complicating pregnancy, third trimester: Secondary | ICD-10-CM

## 2018-12-26 DIAGNOSIS — B379 Candidiasis, unspecified: Secondary | ICD-10-CM

## 2018-12-26 DIAGNOSIS — O09293 Supervision of pregnancy with other poor reproductive or obstetric history, third trimester: Secondary | ICD-10-CM | POA: Diagnosis not present

## 2018-12-26 DIAGNOSIS — O99213 Obesity complicating pregnancy, third trimester: Secondary | ICD-10-CM | POA: Diagnosis not present

## 2018-12-26 MED ORDER — TERCONAZOLE 0.4 % VA CREA: 1.0000 | TOPICAL_CREAM | Freq: Every day | VAGINAL | 0 refills | Status: DC

## 2018-12-26 NOTE — Telephone Encounter (Signed)
-----   Message from Hermina Staggers, MD sent at 12/25/2018 11:04 AM EST ----- Terazol vaginal cream  1 applicator qhs x 7 days Thanks Casimiro Needle

## 2018-12-26 NOTE — Telephone Encounter (Signed)
Called pt to inform her that her swabs done at her last visit showed she has a yeast infection.  Terazol vaginal cream sent to the [facility] on [facility].  Pt instructed to use the medication as prescribed for 7 days.  Pt verbalized understanding.

## 2019-01-02 ENCOUNTER — Ambulatory Visit (INDEPENDENT_AMBULATORY_CARE_PROVIDER_SITE_OTHER): Payer: Medicaid Other | Admitting: Internal Medicine

## 2019-01-02 VITALS — BP 118/59 | HR 101 | Wt 310.0 lb

## 2019-01-02 DIAGNOSIS — O09213 Supervision of pregnancy with history of pre-term labor, third trimester: Secondary | ICD-10-CM

## 2019-01-02 DIAGNOSIS — K219 Gastro-esophageal reflux disease without esophagitis: Secondary | ICD-10-CM

## 2019-01-02 DIAGNOSIS — O09219 Supervision of pregnancy with history of pre-term labor, unspecified trimester: Secondary | ICD-10-CM

## 2019-01-02 DIAGNOSIS — O0993 Supervision of high risk pregnancy, unspecified, third trimester: Secondary | ICD-10-CM

## 2019-01-02 DIAGNOSIS — O0990 Supervision of high risk pregnancy, unspecified, unspecified trimester: Secondary | ICD-10-CM

## 2019-01-02 MED ORDER — RANITIDINE HCL 150 MG PO TABS: 150.0000 mg | ORAL_TABLET | Freq: Two times a day (BID) | ORAL | 1 refills | Status: DC

## 2019-01-02 NOTE — Progress Notes (Signed)
   PRENATAL VISIT NOTE  Subjective:  Jill Mullen is a 33 y.o. [id number] at [redacted]w[redacted]d being seen today for ongoing prenatal care.  She is currently monitored for the following issues for this high-risk pregnancy and has Supervision of high risk pregnancy, antepartum and Previous preterm delivery, antepartum on their problem list.  Patient reports heartburn.  Contractions: Irritability. Vag. Bleeding: None.  Movement: Present. Denies leaking of fluid.   The following portions of the patient's history were reviewed and updated as appropriate: allergies, current medications, past family history, past medical history, past social history, past surgical history and problem list. Problem list updated.  Objective:   Vitals:   01/02/19 0942  BP: (!) 118/59  Pulse: (!) 101  Weight: (!) 310 lb (140.6 kg)    Fetal Status: Fetal Heart Rate (bpm): 145 Fundal Height: 35 cm Movement: Present  Presentation: Vertex  General:  Alert, oriented and cooperative. Patient is in no acute distress.  Skin: Skin is warm and dry. No rash noted.   Cardiovascular: Normal heart rate noted  Respiratory: Normal respiratory effort, no problems with respiration noted  Abdomen: Soft, gravid, appropriate for gestational age.  Pain/Pressure: Present     Pelvic: Cervical exam performed Dilation: Closed Effacement (%): Thick    Extremities: Normal range of motion.  Edema: Trace  Mental Status: Normal mood and affect. Normal behavior. Normal judgment and thought content.   Assessment and Plan:  Pregnancy: [id number] at [redacted]w[redacted]d  1. Supervision of high risk pregnancy, antepartum Continue routine PNC.  Reviewed normal 1h GTT.   2. Previous preterm delivery, antepartum Patient with irritability reported. Cervix is closed.   3. Gastroesophageal reflux disease, esophagitis presence not specified - ranitidine (ZANTAC) 150 MG tablet; Take 1 tablet (150 mg total) by mouth 2 (two) times daily.  Dispense: 60 tablet; Refill:  1  Preterm labor symptoms and general obstetric precautions including but not limited to vaginal bleeding, contractions, leaking of fluid and fetal movement were reviewed in detail with the patient. Please refer to After Visit Summary for other counseling recommendations.  Return in about 2 weeks (around 01/16/2019) for high risk OB.  Future Appointments  Date Time Provider Department Center  01/16/2019 11:15 AM Gwenevere Abbot, MD [facility]  01/23/2019 10:30 AM [facility] [place] 1 [facility]    De Hollingshead, DO

## 2019-01-02 NOTE — Patient Instructions (Signed)

## 2019-01-16 ENCOUNTER — Ambulatory Visit (INDEPENDENT_AMBULATORY_CARE_PROVIDER_SITE_OTHER): Payer: Medicaid Other | Admitting: Family Medicine

## 2019-01-16 ENCOUNTER — Other Ambulatory Visit (HOSPITAL_COMMUNITY)
Admission: RE | Admit: 2019-01-16 | Discharge: 2019-01-16 | Disposition: A | Discharge status: Home / Self Care | Payer: Medicaid Other | Source: Ambulatory Visit | Attending: Family Medicine | Admitting: Family Medicine

## 2019-01-16 VITALS — BP 117/76 | HR 101 | Wt 316.4 lb

## 2019-01-16 DIAGNOSIS — O0993 Supervision of high risk pregnancy, unspecified, third trimester: Secondary | ICD-10-CM

## 2019-01-16 DIAGNOSIS — O0990 Supervision of high risk pregnancy, unspecified, unspecified trimester: Secondary | ICD-10-CM | POA: Diagnosis present

## 2019-01-16 LAB — OB RESULTS CONSOLE GBS: STREP GROUP B AG: POSITIVE

## 2019-01-16 NOTE — Patient Instructions (Addendum)
Places to have your son circumcised:    [facility] [phone] $480 while you are in hospital  [facility] [phone] $244 by 4 wks  [facility] [phone] $[phone] by 2 wks  [facility] [phone] $250 by 7 days [facility] [phone] $[phone] by 4 wks  These prices sometimes change but are roughly what you can expect to pay. Please call and confirm pricing.   Circumcision is considered an elective/non-medically necessary procedure. There are many reasons parents decide to have their sons circumsized. During the first year of life circumcised males have a reduced risk of urinary tract infections but after this year the rates between circumcised males and uncircumcised males are the same.  It is safe to have your son circumcised outside of the hospital and the places above perform them regularly.   Deciding about Circumcision in Baby Boys  (Up-to-date The Basics)  What is circumcision?  Circumcision is a surgery that removes the skin that covers the tip of the penis, called the "foreskin" Circumcision is usually done when a boy is between 1 and 10 days old. In the [place], circumcision is common. In some other countries, fewer boys are circumcised. Circumcision is a common tradition in some religions.  Should I have my baby boy circumcised?  There is no easy answer. Circumcision has some benefits. But it also has risks. After talking with your doctor, you will have to decide for yourself what is right for your family.  What are the benefits of circumcision?  Circumcised boys seem to have slightly lower rates of: ?Urinary tract infections ?Swelling of the opening at the tip of the penis Circumcised men seem to have slightly lower rates of: ?Urinary tract infections ?Swelling of the opening at the tip of the penis ?Penis  cancer ?HIV and other infections that you catch during sex ?Cervical cancer in the women they have sex with Even so, in the [place], the risks of these problems are small - even in boys and men who have not been circumcised. Plus, boys and men who are not circumcised can reduce these extra risks by: ?Cleaning their penis well ?Using condoms during sex  What are the risks of circumcision?  Risks include: ?Bleeding or infection from the surgery ?Damage to or amputation of the penis ?A chance that the doctor will cut off too much or not enough of the foreskin ?A chance that sex won't feel as good later in life Only about 1 out of every 200 circumcisions leads to problems. There is also a chance that your health insurance won't pay for circumcision.  How is circumcision done in baby boys?  First, the baby gets medicine for pain relief. This might be a cream on the skin or a shot into the base of the penis. Next, the doctor cleans the baby's penis well. Then he or she uses special tools to cut off the foreskin. Finally, the doctor wraps a bandage (called gauze) around the baby's penis. If you have your baby circumcised, his doctor or nurse will give you instructions on how to care for him after the surgery. It is important that you follow those instructions carefully. AREA PEDIATRIC/FAMILY PRACTICE PHYSICIANS  [facility] [address], [address] [place], [place]  [address] Phone - [phone]   Fax - [phone]  [facility] [address] [place], [place] [address] Phone - [phone]   Fax - [phone]  [facility] [address] [place],     [address] Phone - [phone]   Fax - [phone]  [facility] [address] [place], [place]  [address] Phone - [phone]   Fax - [phone]  [facility] [address] [place], [place]  [address] Phone - [phone]   Fax -  [phone]  [facility] [address], [address] [place], [place]  [address] Phone - [phone]   Fax - [phone]  [facility] [address] [place], [place]  [address] Phone - [phone]   Fax - [phone]  [facility] [address] [place], [place]  [address] Phone - [phone]   Fax - [phone]  [facility] [address] [place], [place]  [address] Phone - [phone]   Fax - [phone] [facility] [address] [place], [place]  [address] Phone - [phone]   Fax - [phone]  [facility] [address] [place], [place]  [address] Phone - [phone]   Fax - [phone]  [facility] [address] [place], [place]  [address] Phone - [phone]   Fax - [phone]  [facility] [address], [address] [place], [place]  [address] Phone - [phone]   Fax - [phone]  [facility] [address] [place], [place]  [address] Phone - [phone]  [facility] [address] [place], [place]  [address] Phone - [phone]   Fax - [phone]  [facility] [address] [place], [place]  [address] Phone - [phone]   Fax - [phone]  [facility] [address] [place], [place]  [address] Phone - [phone]   Fax - [phone]  [facility] [address] [place], [place]  [address] Phone - [phone]   Fax - [phone]   [facility] [address] [place], [place]  [address] Phone - [phone]   Fax - [phone]  [facility] [address] [place], [place]  [address] Phone - [phone]   Fax - [phone]  [facility] [address] [place], [place]  [address] Phone - [phone]   Fax - [phone]  [facility] [address] , [place]  [address] Phone - [phone]   Fax - [phone]  [facility] [address] , [place]  [address] Phone - [phone]   Fax - [phone] [facility] [address] [place], [place]  [address] Phone - [phone]   Fax - [phone]  [facility] [address] [place], [place]  [address] Phone - [phone]   Fax - [phone]  [facility] [address] [place], [place]  [address] Phone - [phone]   Fax - [phone]  [facility] [place] Charlene Flemming MD [address] [place]  [address] Phone [phone]  Fax [phone]   

## 2019-01-16 NOTE — Progress Notes (Signed)
   PRENATAL VISIT NOTE  Subjective:  Jill Mullen is a 33 y.o. [id number] at [redacted]w[redacted]d being seen today for ongoing prenatal care.  She is currently monitored for the following issues for this high-risk pregnancy and has Supervision of high risk pregnancy, antepartum and Previous preterm delivery, antepartum on their problem list.  Patient reports no complaints.  Contractions: Irritability. Vag. Bleeding: None.  Movement: Present. Denies leaking of fluid.   The following portions of the patient's history were reviewed and updated as appropriate: allergies, current medications, past family history, past medical history, past social history, past surgical history and problem list. Problem list updated.  Objective:   Vitals:   01/16/19 1141  BP: 117/76  Pulse: (!) 101  Weight: (!) 316 lb 6.4 oz (143.5 kg)    Fetal Status: Fetal Heart Rate (bpm): 144   Movement: Present     General:  Alert, oriented and cooperative. Patient is in no acute distress.  Skin: Skin is warm and dry. No rash noted.   Cardiovascular: Normal heart rate noted  Respiratory: Normal respiratory effort, no problems with respiration noted  Abdomen: Soft, gravid, appropriate for gestational age.  Pain/Pressure: Present     Pelvic: Cervical exam deferred        Extremities: Normal range of motion.  Edema: Trace  Mental Status: Normal mood and affect. Normal behavior. Normal judgment and thought content.   Assessment and Plan:  Pregnancy: [id number] at [redacted]w[redacted]d  1. Supervision of high risk pregnancy, antepartum - doing well today  - GC/Chlamydia probe amp ([place])not at [facility]  Preterm labor symptoms and general obstetric precautions including but not limited to vaginal bleeding, contractions, leaking of fluid and fetal movement were reviewed in detail with the patient. Please refer to After Visit Summary for other counseling recommendations.  Return in about 1 week (around 01/23/2019) for HROB.  Future Appointments    Date Time Provider Department Center  01/23/2019 10:30 AM [facility] [place] 1 [facility]  01/26/2019 11:15 AM Levie Heritage, DO [facility]    Gwenevere Abbot, MD

## 2019-01-17 LAB — CERVICOVAGINAL ANCILLARY ONLY
Bacterial vaginitis: NEGATIVE
CHLAMYDIA, DNA PROBE: NEGATIVE
Candida vaginitis: NEGATIVE
Neisseria Gonorrhea: NEGATIVE

## 2019-01-21 LAB — STREP GP B SUSCEPTIBILITY

## 2019-01-21 LAB — STREP GP B CULTURE+RFLX: Strep Gp B Culture+Rflx: POSITIVE — AB

## 2019-01-22 ENCOUNTER — Encounter: Payer: Self-pay | Admitting: Family Medicine

## 2019-01-22 DIAGNOSIS — B951 Streptococcus, group B, as the cause of diseases classified elsewhere: Secondary | ICD-10-CM | POA: Insufficient documentation

## 2019-01-22 DIAGNOSIS — O98819 Other maternal infectious and parasitic diseases complicating pregnancy, unspecified trimester: Secondary | ICD-10-CM

## 2019-01-23 ENCOUNTER — Other Ambulatory Visit (HOSPITAL_COMMUNITY): Payer: Self-pay | Admitting: Obstetrics and Gynecology

## 2019-01-23 ENCOUNTER — Telehealth: Payer: Self-pay | Admitting: Obstetrics and Gynecology

## 2019-01-23 ENCOUNTER — Ambulatory Visit (HOSPITAL_BASED_OUTPATIENT_CLINIC_OR_DEPARTMENT_OTHER)
Admission: RE | Admit: 2019-01-23 | Discharge: 2019-01-23 | Disposition: A | Discharge status: Home / Self Care | Payer: Medicaid Other | Source: Ambulatory Visit | Attending: Obstetrics & Gynecology | Admitting: Obstetrics & Gynecology

## 2019-01-23 ENCOUNTER — Encounter (HOSPITAL_COMMUNITY): Payer: Self-pay

## 2019-01-23 ENCOUNTER — Ambulatory Visit (HOSPITAL_COMMUNITY)
Admission: RE | Admit: 2019-01-23 | Discharge: 2019-01-23 | Disposition: A | Discharge status: Home / Self Care | Payer: Medicaid Other | Source: Ambulatory Visit | Attending: Obstetrics & Gynecology | Admitting: Obstetrics & Gynecology

## 2019-01-23 DIAGNOSIS — O09213 Supervision of pregnancy with history of pre-term labor, third trimester: Secondary | ICD-10-CM

## 2019-01-23 DIAGNOSIS — O4103X0 Oligohydramnios, third trimester, not applicable or unspecified: Secondary | ICD-10-CM | POA: Insufficient documentation

## 2019-01-23 DIAGNOSIS — O4103X1 Oligohydramnios, third trimester, fetus 1: Secondary | ICD-10-CM

## 2019-01-23 DIAGNOSIS — Z3A37 37 weeks gestation of pregnancy: Secondary | ICD-10-CM

## 2019-01-23 DIAGNOSIS — O99333 Smoking (tobacco) complicating pregnancy, third trimester: Secondary | ICD-10-CM | POA: Diagnosis not present

## 2019-01-23 DIAGNOSIS — O0933 Supervision of pregnancy with insufficient antenatal care, third trimester: Secondary | ICD-10-CM

## 2019-01-23 DIAGNOSIS — O99213 Obesity complicating pregnancy, third trimester: Secondary | ICD-10-CM

## 2019-01-23 DIAGNOSIS — O09293 Supervision of pregnancy with other poor reproductive or obstetric history, third trimester: Secondary | ICD-10-CM

## 2019-01-23 DIAGNOSIS — Z363 Encounter for antenatal screening for malformations: Secondary | ICD-10-CM

## 2019-01-23 NOTE — Telephone Encounter (Signed)
Called patient to inform her of induction scheduled for midnight. Answered all questions, patient verbalized understanding and will present tonight.    Baldemar Lenis, M.D. Attending Center for [employer] [facility])

## 2019-01-23 NOTE — Procedures (Signed)
Jill Mullen 02-Jul-1986 [redacted]w[redacted]d  Fetus A Non-Stress Test Interpretation for 01/23/19  Indication: Oligohydramnios  Fetal Heart Rate A Mode: External Baseline Rate (A): 140 bpm Variability: Moderate Accelerations: 15 x 15 Decelerations: None  Uterine Activity Mode: Toco Contraction Frequency (min): none noted Contraction Quality: Mild Resting Tone Palpated: Relaxed Resting Time: Adequate  Interpretation (Fetal Testing) Nonstress Test Interpretation: Reactive Comments: FHR tracing rev'd by Dr. Judeth Cornfield

## 2019-01-24 ENCOUNTER — Inpatient Hospital Stay (HOSPITAL_COMMUNITY)
Admission: AD | Admit: 2019-01-24 | Discharge: 2019-01-27 | DRG: 807 | Disposition: A | Discharge status: Home / Self Care | Payer: Medicaid Other | Attending: Family Medicine | Admitting: Family Medicine

## 2019-01-24 ENCOUNTER — Encounter (HOSPITAL_COMMUNITY): Payer: Self-pay

## 2019-01-24 ENCOUNTER — Other Ambulatory Visit: Payer: Self-pay

## 2019-01-24 DIAGNOSIS — O99210 Obesity complicating pregnancy, unspecified trimester: Secondary | ICD-10-CM | POA: Diagnosis present

## 2019-01-24 DIAGNOSIS — Z3A37 37 weeks gestation of pregnancy: Secondary | ICD-10-CM

## 2019-01-24 DIAGNOSIS — O4100X0 Oligohydramnios, unspecified trimester, not applicable or unspecified: Secondary | ICD-10-CM | POA: Diagnosis present

## 2019-01-24 DIAGNOSIS — O98819 Other maternal infectious and parasitic diseases complicating pregnancy, unspecified trimester: Secondary | ICD-10-CM

## 2019-01-24 DIAGNOSIS — Z880 Allergy status to penicillin: Secondary | ICD-10-CM

## 2019-01-24 DIAGNOSIS — O4103X0 Oligohydramnios, third trimester, not applicable or unspecified: Secondary | ICD-10-CM | POA: Diagnosis present

## 2019-01-24 DIAGNOSIS — O99214 Obesity complicating childbirth: Secondary | ICD-10-CM | POA: Diagnosis present

## 2019-01-24 DIAGNOSIS — O99824 Streptococcus B carrier state complicating childbirth: Secondary | ICD-10-CM | POA: Diagnosis present

## 2019-01-24 DIAGNOSIS — Z720 Tobacco use: Secondary | ICD-10-CM | POA: Diagnosis present

## 2019-01-24 DIAGNOSIS — O0990 Supervision of high risk pregnancy, unspecified, unspecified trimester: Secondary | ICD-10-CM

## 2019-01-24 DIAGNOSIS — B951 Streptococcus, group B, as the cause of diseases classified elsewhere: Secondary | ICD-10-CM | POA: Diagnosis present

## 2019-01-24 HISTORY — DX: Unspecified abnormal cytological findings in specimens from vagina: R87.629

## 2019-01-24 LAB — RPR: RPR Ser Ql: NONREACTIVE

## 2019-01-24 LAB — CBC
HCT: 37.4 % (ref 36.0–46.0)
Hemoglobin: 12.5 g/dL (ref 12.0–15.0)
MCH: 30.7 pg (ref 26.0–34.0)
MCHC: 33.4 g/dL (ref 30.0–36.0)
MCV: 91.9 fL (ref 80.0–100.0)
Platelets: 216 10*3/uL (ref 150–400)
RBC: 4.07 MIL/uL (ref 3.87–5.11)
RDW: 14.6 % (ref 11.5–15.5)
WBC: 6.8 10*3/uL (ref 4.0–10.5)
nRBC: 0 % (ref 0.0–0.2)

## 2019-01-24 LAB — TYPE AND SCREEN
ABO/RH(D): O POS
Antibody Screen: NEGATIVE

## 2019-01-24 LAB — ABO/RH: ABO/RH(D): O POS

## 2019-01-24 MED ORDER — LACTATED RINGERS IV SOLN
INTRAVENOUS | Status: DC
  Administered 2019-01-24 – 2019-01-25 (×3): via INTRAVENOUS

## 2019-01-24 MED ORDER — SOD CITRATE-CITRIC ACID 500-334 MG/5ML PO SOLN
30.0000 mL | ORAL | Status: DC | PRN
  Administered 2019-01-24 (×2): 30 mL via ORAL
  Filled 2019-01-24 (×2): qty 15
  Filled 2019-01-24: qty 30

## 2019-01-24 MED ORDER — TERBUTALINE SULFATE 1 MG/ML IJ SOLN
0.2500 mg | Freq: Once | INTRAMUSCULAR | Status: DC | PRN
  Filled 2019-01-24: qty 1

## 2019-01-24 MED ORDER — OXYTOCIN BOLUS FROM INFUSION
500.0000 mL | Freq: Once | INTRAVENOUS | Status: AC
  Administered 2019-01-25: 500 mL via INTRAVENOUS

## 2019-01-24 MED ORDER — CEFAZOLIN SODIUM-DEXTROSE 1-4 GM/50ML-% IV SOLN
1.0000 g | Freq: Three times a day (TID) | INTRAVENOUS | Status: DC
  Administered 2019-01-24 (×2): 1 g via INTRAVENOUS
  Filled 2019-01-24 (×5): qty 50

## 2019-01-24 MED ORDER — OXYTOCIN 40 UNITS IN NORMAL SALINE INFUSION - SIMPLE MED
2.5000 [IU]/h | INTRAVENOUS | Status: DC
  Administered 2019-01-25: 2.5 [IU]/h via INTRAVENOUS
  Filled 2019-01-24: qty 1000

## 2019-01-24 MED ORDER — FENTANYL CITRATE (PF) 100 MCG/2ML IJ SOLN
100.0000 ug | INTRAMUSCULAR | Status: DC | PRN
  Administered 2019-01-24 – 2019-01-25 (×5): 100 ug via INTRAVENOUS
  Filled 2019-01-24 (×5): qty 2

## 2019-01-24 MED ORDER — FLEET ENEMA 7-19 GM/118ML RE ENEM: 1.0000 | ENEMA | RECTAL | Status: DC | PRN

## 2019-01-24 MED ORDER — ONDANSETRON HCL 4 MG/2ML IJ SOLN: 4.0000 mg | Freq: Four times a day (QID) | INTRAMUSCULAR | Status: DC | PRN

## 2019-01-24 MED ORDER — LIDOCAINE HCL (PF) 1 % IJ SOLN
30.0000 mL | INTRAMUSCULAR | Status: DC | PRN
  Filled 2019-01-24: qty 30

## 2019-01-24 MED ORDER — MISOPROSTOL 25 MCG QUARTER TABLET
25.0000 ug | ORAL_TABLET | ORAL | Status: DC | PRN
  Administered 2019-01-24 (×2): 25 ug via VAGINAL
  Filled 2019-01-24 (×3): qty 1

## 2019-01-24 MED ORDER — MISOPROSTOL 50MCG HALF TABLET
50.0000 ug | ORAL_TABLET | ORAL | Status: DC | PRN
  Administered 2019-01-24 (×3): 50 ug via ORAL
  Filled 2019-01-24 (×4): qty 1

## 2019-01-24 MED ORDER — CEFAZOLIN SODIUM-DEXTROSE 2-4 GM/100ML-% IV SOLN
2.0000 g | Freq: Once | INTRAVENOUS | Status: AC
  Administered 2019-01-24: 2 g via INTRAVENOUS

## 2019-01-24 MED ORDER — LACTATED RINGERS IV SOLN: 500.0000 mL | INTRAVENOUS | Status: DC | PRN

## 2019-01-24 MED ORDER — ZOLPIDEM TARTRATE 5 MG PO TABS
5.0000 mg | ORAL_TABLET | Freq: Once | ORAL | Status: AC
  Administered 2019-01-24: 5 mg via ORAL
  Filled 2019-01-24: qty 1

## 2019-01-24 NOTE — Progress Notes (Signed)
OB/GYN Faculty Practice: Labor Progress Note  Subjective: Doing well, has been sleeping. Feeling some cramping. Does not want FB at this time, wants to take things slowly.   Objective: BP 140/82   Pulse 89   Temp 98.3 F (36.8 C) (Oral)   Resp 18   Ht 5\' 9"  (1.753 m)   Wt (!) 141.5 kg   LMP 05/08/2018   BMI 46.07 kg/m  Gen: well-appearing, NAD Dilation: 1.5 Effacement (%): 30 Cervical Position: Posterior Presentation: Vertex Exam by:: Dr. Earlene Plater  Assessment and Plan: 33 y.o. L4T6256 [redacted]w[redacted]d here for IOL for oligohydramnios.   Labor: Induction to continue with 2nd dose of cytotec. Has changed since first dose, will try for AROM with next check.  -- pain control: would like epidural  -- PPH Risk: medium  Fetal Well-Being: EFW 2973g, 57%. Cephalic by sutures.  -- Category I - continuous fetal monitoring  -- GBS positive - Ancef    Crystian Frith S. Earlene Plater, DO OB/GYN Fellow, Faculty Practice  7:51 AM

## 2019-01-24 NOTE — H&P (Addendum)
LABOR AND DELIVERY ADMISSION HISTORY AND PHYSICAL NOTE  Jill Mullen is a 33 y.o. female [phone] with IUP at [redacted]w[redacted]d by L/21 presenting for IOL due to oligohydramnios.   She reports positive fetal movement. She denies leakage of fluid or vaginal bleeding.  Prenatal History/Complications: PNC at [facility], transferred from [facility] Pregnancy complications:  - oligohydramnios - late PNC (22w4) - Hx of gHTN  Past Medical History: Past Medical History:  Diagnosis Date  . Hypertension   . Medical history non-contributory   . Pregnancy induced hypertension   . Vaginal Pap smear, abnormal 2005    Past Surgical History: Past Surgical History:  Procedure Laterality Date  . TONSILLECTOMY      Obstetrical History: OB History    Gravida  4   Para  2   Term  1   Preterm  1   AB  1   Living  2     SAB  1   TAB      Ectopic      Multiple      Live Births  2           Social History: Social History   Socioeconomic History  . Marital status: Married    Spouse name: Dru  . Number of children: Not on file  . Years of education: Not on file  . Highest education level: Not on file  Occupational History  . Not on file  Social Needs  . Financial resource strain: Somewhat hard  . Food insecurity:    Worry: Never true    Inability: Never true  . Transportation needs:    Medical: No    Non-medical: No  Tobacco Use  . Smoking status: Never Smoker  . Smokeless tobacco: Never Used  Substance and Sexual Activity  . Alcohol use: Not Currently  . Drug use: Yes    Types: Marijuana  . Sexual activity: Yes    Birth control/protection: None  Lifestyle  . Physical activity:    Days per week: Patient refused    Minutes per session: Patient refused  . Stress: Not at all  Relationships  . Social connections:    Talks on phone: More than three times a week    Gets together: More than three times a week    Attends religious service: More than 4 times per year    Active  member of club or organization: Patient refused    Attends meetings of clubs or organizations: Patient refused    Relationship status: Married  Other Topics Concern  . Not on file  Social History Narrative  . Not on file    Family History: Family History  Problem Relation Age of Onset  . Depression Mother   . Obesity Mother   . Drug abuse Father   . Obesity Father   . ADD / ADHD Sister   . ADD / ADHD Brother   . ADD / ADHD Daughter   . Diabetes Daughter   . Intellectual disability Daughter   . Learning disabilities Daughter   . ADD / ADHD Son   . Arthritis Maternal Grandmother   . Cancer Maternal Grandmother   . Cancer Maternal Grandfather     Allergies: Allergies  Allergen Reactions  . Penicillins Anaphylaxis and Hives    Medications Prior to Admission  Medication Sig Dispense Refill Last Dose  . ASPIRIN 81 PO Take by mouth.   Taking  . Elastic Bandages & Supports (COMFORT FIT MATERNITY SUPP LG) MISC 1 Device  by Does not apply route as needed. 1 each 0 Taking  . Prenatal Multivit-Min-Fe-FA (PRENATAL VITAMINS PO) Take by mouth.   Taking  . ranitidine (ZANTAC) 150 MG tablet Take 1 tablet (150 mg total) by mouth 2 (two) times daily. 60 tablet 1 Taking  . terconazole (TERAZOL 7) 0.4 % vaginal cream Place 1 applicator vaginally at bedtime. (Patient not taking: Reported on 01/02/2019) 45 g 0 Not Taking     Review of Systems  All systems reviewed and negative except as stated in HPI  Physical Exam Blood pressure 136/79, pulse 84, temperature 97.8 F (36.6 C), temperature source Oral, resp. rate 18, height 5\' 9"  (1.753 m), weight (!) 141.5 kg, last menstrual period 05/08/2018. General appearance: alert, cooperative and no distress Lungs: normal work of breathing Heart: regular rate and rhythm Abdomen: gravid Extremities: No calf swelling or tenderness Presentation: cephalic confirmed via U/S on 12/24/1476 Fetal monitoring: 130s/mod/+a/-d Uterine activity:  irregular  Prenatal labs: ABO, Rh: O/Positive/-- (10/24 0000) Antibody: Negative (10/24 0000) Rubella:   RPR: Non Reactive (01/16 1453)  HBsAg: Negative (10/24 0000)  HIV: Non Reactive (01/16 1453)  GC/Chlamydia: Negative GBS:   GBS + with allergy to PCN and culture was resistant to Clindamycin 1 hr Glucola: 105 Genetic screening: not performed, late to care Anatomy US: oligohydramnios, otherwise normal anatomy  Prenatal Transfer Tool  Maternal Diabetes: No Genetic Screening: Declined Maternal Ultrasounds/Referrals: Abnormal:  Findings:   Other:Oligohydramnios Fetal Ultrasounds or other Referrals:  None Maternal Substance Abuse:  No Significant Maternal Medications:  None Significant Maternal Lab Results: None  Results for orders placed or performed during the hospital encounter of 01/24/19 (from the past 24 hour(s))  CBC   Collection Time: 01/24/19  2:38 AM  Result Value Ref Range   WBC 6.8 4.0 - 10.5 K/uL   RBC 4.07 3.87 - 5.11 MIL/uL   Hemoglobin 12.5 12.0 - 15.0 g/dL   HCT 29.5 62.1 - 30.8 %   MCV 91.9 80.0 - 100.0 fL   MCH 30.7 26.0 - 34.0 pg   MCHC 33.4 30.0 - 36.0 g/dL   RDW 65.7 84.6 - 96.2 %   Platelets 216 150 - 400 K/uL   nRBC 0.0 0.0 - 0.2 %    Patient Active Problem List   Diagnosis Date Noted  . Morbid obesity (HCC) 01/24/2019  . Oligohydramnios 01/24/2019  . Group B streptococcal infection during pregnancy 01/22/2019  . Supervision of high risk pregnancy, antepartum 10/13/2018  . Previous preterm delivery, antepartum 10/13/2018  . Tobacco use 03/23/2014    Assessment: Jill Mullen is a 33 y.o. [id number] at [redacted]w[redacted]d here for IOL due to oligohydramnios found on her [place] on 01/23/2019  #Labor: Induction to start with cytotec, cervix closed. Will try to place FB when able.  #Pain: Anthesia/analgesia prn; epidural on request #FWB: Cat 1 #ID: GBS + with allergy to PCN; Ancef per protocol #MOF: breast #MOC: undecided #Circ: boy, no  Arlyce Harman,  DO, PGY2  Attestation: I have seen this patient and agree with the resident's documentation. I have examined them separately, and we have discussed the plan of care.  Cristal Deer. Earlene Plater, DO OB/GYN Fellow

## 2019-01-24 NOTE — Progress Notes (Signed)
LABOR PROGRESS NOTE  Jill Mullen is a 33 y.o. [phone] at [redacted]w[redacted]d  admitted for IOL for oligohydramnios  Subjective: Feeling comfortable without pain medication Contractions stronger Would like to avoid as much intervention as possible including foley, pitocin and pain medication   Objective: BP (!) 141/81   Pulse 87   Temp 98.3 F (36.8 C) (Oral)   Resp 18   Ht 5\' 9"  (1.753 m)   Wt (!) 141.5 kg   LMP 05/08/2018   BMI 46.07 kg/m  or  Vitals:   01/24/19 0818 01/24/19 0906 01/24/19 1015 01/24/19 1102  BP: (!) 152/83 135/87 134/85 (!) 141/81  Pulse: 90 87 98 87  Resp: 18 18 18 18   Temp:      TempSrc:      Weight:      Height:         Dilation: 3 Effacement (%): 50 Cervical Position: Posterior Station: -3 Presentation: Vertex Exam by:: Dr. Aneta Mins FHT: baseline rate 135, moderate varibility, + acel, early decel Toco: irregular contractions and irritability    Labs: Lab Results  Component Value Date   WBC 6.8 01/24/2019   HGB 12.5 01/24/2019   HCT 37.4 01/24/2019   MCV 91.9 01/24/2019   PLT 216 01/24/2019    Patient Active Problem List   Diagnosis Date Noted  . Morbid obesity (HCC) 01/24/2019  . Oligohydramnios 01/24/2019  . Group B streptococcal infection during pregnancy 01/22/2019  . Supervision of high risk pregnancy, antepartum 10/13/2018  . Previous preterm delivery, antepartum 10/13/2018  . Tobacco use 03/23/2014    Assessment / Plan: 33 y.o. [id number] at [redacted]w[redacted]d here for IOL for oligohydramnios  Labor: progressing. Will give 1 more dose of cytotec since still thick and posterior. Re-eval in 4 hours.  Fetal Wellbeing:  Cat 2 (reassuring) Pain Control:  comfortable Anticipated MOD:  SVD  Gwenevere Abbot, MD  OB Fellow  01/24/2019, 11:31 AM

## 2019-01-24 NOTE — Progress Notes (Signed)
OB/GYN Faculty Practice: Labor Progress Note  Subjective: Pt coping well with ctx.  Requesting cervical exam.     Objective: BP 139/80 (BP Location: Left Arm)   Pulse 85   Temp 98.1 F (36.7 C) (Oral)   Resp 18   Ht 5\' 9"  (1.753 m)   Wt (!) 141.5 kg   LMP 05/08/2018   BMI 46.07 kg/m    Ctx 3-69mins,  FHR 130s, +accels, no decels Dilation: 4 Effacement (%): 60 Cervical Position: Posterior Station: -3 Presentation: Vertex Exam by:: Loula Marcella CNM   Assessment and Plan: 33 y.o. V2N1916 [redacted]w[redacted]d  Oligohydramnios Early active labor from IOL process GBS+  Labor: will continue to manage expectantly without intervention if possible. Pt to use breast pump  -- pain control: IV pain medication -- PPH Risk: medium  Fetal Well-Being: EFW 57%. Cephalic by cervical exam.     Camelia Eng, SNM 11:56 PM

## 2019-01-24 NOTE — Anesthesia Pain Management Evaluation Note (Addendum)
  CRNA Pain Management Visit Note  Patient: Jill Mullen, 33 y.o., female  "Hello I am a member of the anesthesia team at [facility]. We have an anesthesia team available at all times to provide care throughout the hospital, including epidural management and anesthesia for C-section. I don't know your plan for the delivery whether it a natural birth, water birth, IV sedation, nitrous supplementation, doula or epidural, but we want to meet your pain goals."   1.Was your pain managed to your expectations on prior hospitalizations?   Yes   2.What is your expectation for pain management during this hospitalization?   Nitrous oxide, IV pain meds  3.How can we help you reach that goal? Pt does not desire labor epidural at this time  Record the patient's initial score and the patient's pain goal.   Pain: 3  Pain Goal: 10 [facility] wants you to be able to say your pain was always managed very well.  Jill Mullen 01/24/2019

## 2019-01-24 NOTE — Progress Notes (Signed)
Patient ID: Jill Mullen, female   DOB: 04-03-1986, 33 y.o.   MRN: [id number]  Coping well with ctx; desires as noninterventive labor as possible; fluid leaking at 2056; Ancef x 3 doses  BP 139/80, other VSS FHR 130s, +accels, no decels Ctx 3-5 mins Cx deferred, but was 3-4/thick at last exam  IUP@37 .2wks Oligohydramnios Early active labor from IOL process GBS pos  Will continue to manage expectantly; hopeful that labor will increase in the next few hours  Arabella Merles CNM 01/24/2019 9:15 PM

## 2019-01-24 NOTE — Plan of Care (Signed)
  Problem: Education: Goal: Knowledge of Childbirth will improve Outcome: Progressing Goal: Ability to make informed decisions regarding treatment and plan of care will improve Outcome: Progressing Goal: Ability to state and carry out methods to decrease the pain will improve Outcome: Progressing   Problem: Coping: Goal: Ability to verbalize concerns and feelings about labor and delivery will improve Outcome: Progressing   Problem: Life Cycle: Goal: Ability to make normal progression through stages of labor will improve Outcome: Progressing Goal: Ability to effectively push during vaginal delivery will improve Outcome: Progressing   Problem: Role Relationship: Goal: Ability to demonstrate positive interaction with the child will improve Outcome: Progressing   Problem: Safety: Goal: Risk of complications during labor and delivery will decrease Outcome: Progressing   Problem: Pain Management: Goal: Relief or control of pain from uterine contractions will improve Outcome: Progressing   Problem: Health Behavior/Discharge Planning: Goal: Ability to manage health-related needs will improve Outcome: Progressing   Problem: Clinical Measurements: Goal: Ability to maintain clinical measurements within normal limits will improve Outcome: Progressing Goal: Will remain free from infection Outcome: Progressing Goal: Diagnostic test results will improve Outcome: Progressing Goal: Respiratory complications will improve Outcome: Progressing Goal: Cardiovascular complication will be avoided Outcome: Progressing   Problem: Activity: Goal: Risk for activity intolerance will decrease Outcome: Progressing   Problem: Nutrition: Goal: Adequate nutrition will be maintained Outcome: Progressing   Problem: Elimination: Goal: Will not experience complications related to bowel motility Outcome: Progressing Goal: Will not experience complications related to urinary retention Outcome:  Progressing   Problem: Pain Managment: Goal: General experience of comfort will improve Outcome: Progressing   Problem: Skin Integrity: Goal: Risk for impaired skin integrity will decrease Outcome: Progressing   

## 2019-01-25 ENCOUNTER — Encounter (HOSPITAL_COMMUNITY): Payer: Self-pay

## 2019-01-25 DIAGNOSIS — O4103X0 Oligohydramnios, third trimester, not applicable or unspecified: Secondary | ICD-10-CM

## 2019-01-25 DIAGNOSIS — O99824 Streptococcus B carrier state complicating childbirth: Secondary | ICD-10-CM

## 2019-01-25 DIAGNOSIS — Z3A37 37 weeks gestation of pregnancy: Secondary | ICD-10-CM

## 2019-01-25 MED ORDER — SENNOSIDES-DOCUSATE SODIUM 8.6-50 MG PO TABS
2.0000 | ORAL_TABLET | ORAL | Status: DC
  Administered 2019-01-25 – 2019-01-27 (×2): 2 via ORAL
  Filled 2019-01-25 (×2): qty 2

## 2019-01-25 MED ORDER — OXYCODONE HCL 5 MG PO TABS
5.0000 mg | ORAL_TABLET | ORAL | Status: DC | PRN
  Administered 2019-01-25 – 2019-01-27 (×11): 5 mg via ORAL
  Filled 2019-01-25 (×11): qty 1

## 2019-01-25 MED ORDER — ZOLPIDEM TARTRATE 5 MG PO TABS: 5.0000 mg | ORAL_TABLET | Freq: Every evening | ORAL | Status: DC | PRN

## 2019-01-25 MED ORDER — FAMOTIDINE 20 MG PO TABS
20.0000 mg | ORAL_TABLET | Freq: Two times a day (BID) | ORAL | Status: DC
  Administered 2019-01-25 – 2019-01-27 (×5): 20 mg via ORAL
  Filled 2019-01-25 (×5): qty 1

## 2019-01-25 MED ORDER — BENZOCAINE-MENTHOL 20-0.5 % EX AERO
1.0000 "application " | INHALATION_SPRAY | CUTANEOUS | Status: DC | PRN
  Filled 2019-01-25: qty 56

## 2019-01-25 MED ORDER — SIMETHICONE 80 MG PO CHEW
80.0000 mg | CHEWABLE_TABLET | ORAL | Status: DC | PRN
  Administered 2019-01-25 (×3): 80 mg via ORAL
  Filled 2019-01-25 (×3): qty 1

## 2019-01-25 MED ORDER — COCONUT OIL OIL: 1.0000 "application " | TOPICAL_OIL | Status: DC | PRN

## 2019-01-25 MED ORDER — IBUPROFEN 600 MG PO TABS
600.0000 mg | ORAL_TABLET | Freq: Four times a day (QID) | ORAL | Status: DC
  Administered 2019-01-25 – 2019-01-27 (×10): 600 mg via ORAL
  Filled 2019-01-25 (×11): qty 1

## 2019-01-25 MED ORDER — TETANUS-DIPHTH-ACELL PERTUSSIS 5-2.5-18.5 LF-MCG/0.5 IM SUSP: 0.5000 mL | Freq: Once | INTRAMUSCULAR | Status: DC

## 2019-01-25 MED ORDER — DIPHENHYDRAMINE HCL 25 MG PO CAPS: 25.0000 mg | ORAL_CAPSULE | Freq: Four times a day (QID) | ORAL | Status: DC | PRN

## 2019-01-25 MED ORDER — WITCH HAZEL-GLYCERIN EX PADS: 1.0000 "application " | MEDICATED_PAD | CUTANEOUS | Status: DC | PRN

## 2019-01-25 MED ORDER — ONDANSETRON HCL 4 MG/2ML IJ SOLN: 4.0000 mg | INTRAMUSCULAR | Status: DC | PRN

## 2019-01-25 MED ORDER — PRENATAL MULTIVITAMIN CH
1.0000 | ORAL_TABLET | Freq: Every day | ORAL | Status: DC
  Administered 2019-01-25 – 2019-01-27 (×3): 1 via ORAL
  Filled 2019-01-25 (×3): qty 1

## 2019-01-25 MED ORDER — DIBUCAINE 1 % RE OINT: 1.0000 "application " | TOPICAL_OINTMENT | RECTAL | Status: DC | PRN

## 2019-01-25 MED ORDER — ACETAMINOPHEN 325 MG PO TABS
650.0000 mg | ORAL_TABLET | ORAL | Status: DC | PRN
  Administered 2019-01-25 – 2019-01-27 (×11): 650 mg via ORAL
  Filled 2019-01-25 (×11): qty 2

## 2019-01-25 MED ORDER — ONDANSETRON HCL 4 MG PO TABS: 4.0000 mg | ORAL_TABLET | ORAL | Status: DC | PRN

## 2019-01-25 NOTE — Plan of Care (Signed)
  Problem: Education: Goal: Knowledge of Childbirth will improve Outcome: Completed/Met Goal: Ability to make informed decisions regarding treatment and plan of care will improve Outcome: Completed/Met Goal: Ability to state and carry out methods to decrease the pain will improve Outcome: Completed/Met   Problem: Coping: Goal: Ability to verbalize concerns and feelings about labor and delivery will improve Outcome: Completed/Met   Problem: Life Cycle: Goal: Ability to make normal progression through stages of labor will improve Outcome: Completed/Met Goal: Ability to effectively push during vaginal delivery will improve Outcome: Completed/Met   Problem: Role Relationship: Goal: Ability to demonstrate positive interaction with the child will improve Outcome: Completed/Met   Problem: Safety: Goal: Risk of complications during labor and delivery will decrease Outcome: Completed/Met   Problem: Pain Management: Goal: Relief or control of pain from uterine contractions will improve Outcome: Completed/Met

## 2019-01-25 NOTE — Lactation Note (Signed)
This note was copied from a baby's chart. Lactation Consultation Note  Patient Name: Jill Mullen WYSHU'O Date: 01/25/2019 Reason for consult: Initial assessment;Early term 37-38.6wks P3, 2 hour female ETI. Mom is active  on the [facility] program in [facility]  and she attended BF classes in her pregnancy. Infant in Nursery for observation due respiratory distress since birth. LC gave mom collection bullets for EBM with label. Mom demonstrated hand expression and expressed 2 ml of colostrum, Nurse label EBM and took to [place] to feed infant. Mom taught how to use DEBP and mom will pump every 3 hours on initial  setting for 15 minutes. LC notice that mom was expressing colostrum with DEBP. Mom shown how to use DEBP & how to disassemble, clean, & reassemble parts. Once infant is in room, mom knows how to BF according hunger cues, 8 or more times within 24 hours. LC discussed I & O. Mom knows to call Nurse or LC if she needs assistance with latching infant to breast. Mom made aware of O/P services, breastfeeding support groups, community resources, and our phone # for post-discharge questions.  Maternal Data Formula Feeding for Exclusion: No Has patient been taught Hand Expression?: Yes(Mom hand expressed 66ml of colostrum that was taken to [facility] for infant.) Does the patient have breastfeeding experience prior to this delivery?: Yes  Feeding Feeding Type: Breast Milk  LATCH Score                   Interventions Interventions: Breast feeding basics reviewed;Expressed milk;Hand express;Breast massage;DEBP  Lactation Tools Discussed/Used WIC Program: Yes Pump Review: Setup, frequency, and cleaning;Milk Storage Initiated by:: Danelle Earthly, IBCLC Date initiated:: 01/25/19   Consult Status Consult Status: Follow-up Date: 01/25/19 Follow-up type: In-patient    Danelle Earthly 01/25/2019, 6:19 AM

## 2019-01-25 NOTE — Progress Notes (Signed)
Patient had been in NICU and missed her 4hr assessment.  She is stable and bleeding is WNL

## 2019-01-25 NOTE — Lactation Note (Signed)
This note was copied from a baby's chart. Lactation Consultation Note:  Infant transferred to NICU.   Mother reports that she has been pumping every 2-3 hours.  Mother also reports that hand expression has been effective.  She has sent breastmilk vial to NICU.  Mother was given breastmilk labels and bottles.  Mother is active with [place] WIC. A WIC referral was faxed for [facility] to see mother piror to discharge to get her pump.   Mother very receptive to all teaching.  Encouraged mother her to talk with NP or NICU nurse when infant is ready to do STS. Mother very eager to bond with infant.  Mother to follow up with [facility] services as needed.     Patient Name: Boy Nachole Hebron YMEBR'A Date: 01/25/2019 Reason for consult: Follow-up assessment   Maternal Data    Feeding    LATCH Score                   Interventions Interventions: (breastmilk labels and bottles given to mother)  Lactation Tools Discussed/Used WIC Program: Yes   Consult Status Consult Status: Follow-up Date: 01/26/19 Follow-up type: In-patient    Stevan Born [facility] 01/25/2019, 11:58 AM

## 2019-01-25 NOTE — Discharge Summary (Signed)
Postpartum Discharge Summary     Patient Name: Jill Mullen DOB: 10-24-1986 MRN: [id number]  Date of admission: 01/24/2019 Delivering Provider: Brand Males   Date of discharge: 01/27/2019  Admitting diagnosis: INDUCTION Intrauterine pregnancy: [redacted]w[redacted]d     Secondary diagnosis:  Principal Problem:   Oligohydramnios Active Problems:   Group B streptococcal infection during pregnancy   Tobacco use   Morbid obesity (HCC)  Additional problems: none     Discharge diagnosis: Term Pregnancy Delivered                                                                                                Post partum procedures:none  Augmentation: Cytotec  Complications: None  Hospital course:  Induction of Labor With Vaginal Delivery   33 y.o. yo [id number] at [redacted]w[redacted]d was admitted to the hospital 01/24/2019 for induction of labor.  Indication for induction: oligohydramnios.  Patient had an uncomplicated labor course including cytotec x 5 doses followed by SROM, which led to active labor and delivery within 6 hours. Membrane Rupture Time/Date: 8:56 PM ,01/24/2019   Intrapartum Procedures: Episiotomy: None [1]                                         Lacerations:  None [1]  Patient had delivery of a Viable infant.  Information for the patient's newborn:  Tandy, Zoto [[id number]]  Delivery Method: Vaginal, Spontaneous(Filed from Delivery Summary)   01/25/2019  Details of delivery can be found in separate delivery note.  Patient had a routine postpartum course. Infant, however, was taken to the NICU for oxygen and eval for infection on day of delivery. Patient is discharged home 01/27/19. Pt requesting pain meds for involution cramping.  Magnesium Sulfate recieved: No BMZ received: No  Physical exam  Vitals:   01/26/19 0638 01/26/19 1442 01/26/19 2320 01/27/19 0631  BP: (!) 103/58 (!) 105/59 101/68 118/73  Pulse: 76 75 89 84  Resp: 16 16 18 16   Temp: 98.1 F (36.7 C) 98.2 F  (36.8 C) 98.1 F (36.7 C) 98.2 F (36.8 C)  TempSrc: Oral Oral  Oral  SpO2: 100% 100%  99%  Weight:      Height:       General: alert and cooperative Lochia: appropriate Uterine Fundus: firm Incision: N/A DVT Evaluation: No evidence of DVT seen on physical exam. Labs: Lab Results  Component Value Date   WBC 6.8 01/24/2019   HGB 12.5 01/24/2019   HCT 37.4 01/24/2019   MCV 91.9 01/24/2019   PLT 216 01/24/2019   No flowsheet data found.  Discharge instruction: per After Visit Summary and "Baby and Me Booklet".  After visit meds:  Allergies as of 01/27/2019      Reactions   Penicillins Anaphylaxis, Hives      Medication List    STOP taking these medications   COMFORT FIT MATERNITY SUPP LG Misc   ranitidine 150 MG tablet Commonly known as:  ZANTAC     TAKE these medications  ASPIRIN 81 PO Take by mouth.   ibuprofen 600 MG tablet Commonly known as:  ADVIL,MOTRIN Take 1 tablet (600 mg total) by mouth every 6 (six) hours as needed.   oxyCODONE 5 MG immediate release tablet Commonly known as:  Oxy IR/ROXICODONE Take 1 tablet (5 mg total) by mouth every 4 (four) hours as needed (pain scale 4-7).   PRENATAL VITAMINS PO Take by mouth.       Diet: routine diet  Activity: Advance as tolerated. Pelvic rest for 6 weeks.   Outpatient follow up:4 weeks Follow up Appt: Future Appointments  Date Time Provider Department Center  02/23/2019  9:15 AM Katrinka Blazing [place] [facility]   Follow up Visit:   Please schedule this patient for Postpartum visit in: 4 weeks with the following provider: Any provider For C/S patients schedule nurse incision check in weeks 2 weeks: no High risk pregnancy complicated by: oligo Delivery mode:  SVD Anticipated Birth Control:  other/unsure PP Procedures needed: none  Schedule Integrated BH visit: no    Newborn Data: Live born female  Birth Weight:  3050gm APGAR: 8, 8  Newborn Delivery   Birth date/time:  01/25/2019  03:27:00 Delivery type:  Vaginal, Spontaneous     Baby Feeding: Breast Disposition:NICU   01/27/2019 Arabella Merles, CNM  8:47 AM

## 2019-01-26 ENCOUNTER — Encounter: Payer: Self-pay | Admitting: Obstetrics and Gynecology

## 2019-01-26 ENCOUNTER — Encounter: Payer: Self-pay | Admitting: Family Medicine

## 2019-01-26 NOTE — Progress Notes (Addendum)
POSTPARTUM PROGRESS NOTE  Post Partum Day 1  Subjective: Jill Mullen is a 33 y.o. [id number] s/p SVD at [redacted]w[redacted]d.  She reports she is doing well. No acute events overnight. She denies any problems with ambulating, voiding or po intake. Denies nausea or vomiting.  Pain is well controlled.  Lochia is mild.  Objective: Blood pressure (!) 103/58, pulse 76, temperature 98.1 F (36.7 C), temperature source Oral, resp. rate 16, height 5\' 9"  (1.753 m), weight (!) 141.5 kg, last menstrual period 05/08/2018, SpO2 100 %, unknown if currently breastfeeding.  Physical Exam:  General: alert, cooperative and no distress Chest: no respiratory distress Heart:regular rate, distal pulses intact Abdomen: soft Uterine Fundus: firm, appropriately tender DVT Evaluation: No calf swelling or tenderness Extremities: mild 1+ edema Skin: warm, dry  Recent Labs    01/24/19 0238  HGB 12.5  HCT 37.4    Assessment/Plan: Jill Mullen is a 33 y.o. [id number] s/p SVD at [redacted]w[redacted]d   PPD#1 - Doing well Routine postpartum care Contraception: Undecided Feeding: Breast Dispo: Plan for discharge tomorrow.   LOS: 2 days   Orpah Cobb, D.O. [facility], PGY1 01/26/2019, 9:02 AM

## 2019-01-26 NOTE — Discharge Instructions (Signed)
Vaginal Delivery, Care After °Refer to this sheet in the next few weeks. These instructions provide you with information about caring for yourself after vaginal delivery. Your health care provider may also give you more specific instructions. Your treatment has been planned according to current medical practices, but problems sometimes occur. Call your health care provider if you have any problems or questions. °What can I expect after the procedure? °After vaginal delivery, it is common to have: °· Some bleeding from your vagina. °· Soreness in your abdomen, your vagina, and the area of skin between your vaginal opening and your anus (perineum). °· Pelvic cramps. °· Fatigue. °Follow these instructions at home: °Medicines °· Take over-the-counter and prescription medicines only as told by your health care provider. °· If you were prescribed an antibiotic medicine, take it as told by your health care provider. Do not stop taking the antibiotic until it is finished. °Driving ° °· Do not drive or operate heavy machinery while taking prescription pain medicine. °· Do not drive for 24 hours if you received a sedative. °Lifestyle °· Do not drink alcohol. This is especially important if you are breastfeeding or taking medicine to relieve pain. °· Do not use tobacco products, including cigarettes, chewing tobacco, or e-cigarettes. If you need help quitting, ask your health care provider. °Eating and drinking °· Drink at least 8 eight-ounce glasses of water every day unless you are told not to by your health care provider. If you choose to breastfeed your baby, you may need to drink more water than this. °· Eat high-fiber foods every day. These foods may help prevent or relieve constipation. High-fiber foods include: °? Whole grain cereals and breads. °? Brown rice. °? Beans. °? Fresh fruits and vegetables. °Activity °· Return to your normal activities as told by your health care provider. Ask your health care provider what  activities are safe for you. °· Rest as much as possible. Try to rest or take a nap when your baby is sleeping. °· Do not lift anything that is heavier than your baby or 10 lb (4.5 kg) until your health care provider says that it is safe. °· Talk with your health care provider about when you can engage in sexual activity. This may depend on your: °? Risk of infection. °? Rate of healing. °? Comfort and desire to engage in sexual activity. °Vaginal Care °· If you have an episiotomy or a vaginal tear, check the area every day for signs of infection. Check for: °? More redness, swelling, or pain. °? More fluid or blood. °? Warmth. °? Pus or a bad smell. °· Do not use tampons or douches until your health care provider says this is safe. °· Watch for any blood clots that may pass from your vagina. These may look like clumps of dark red, brown, or black discharge. °General instructions °· Keep your perineum clean and dry as told by your health care provider. °· Wear loose, comfortable clothing. °· Wipe from front to back when you use the toilet. °· Ask your health care provider if you can shower or take a bath. If you had an episiotomy or a perineal tear during labor and delivery, your health care provider may tell you not to take baths for a certain length of time. °· Wear a bra that supports your breasts and fits you well. °· If possible, have someone help you with household activities and help care for your baby for at least a few days after you   leave the hospital. °· Keep all follow-up visits for you and your baby as told by your health care provider. This is important. °Contact a health care provider if: °· You have: °? Vaginal discharge that has a bad smell. °? Difficulty urinating. °? Pain when urinating. °? A sudden increase or decrease in the frequency of your bowel movements. °? More redness, swelling, or pain around your episiotomy or vaginal tear. °? More fluid or blood coming from your episiotomy or vaginal  tear. °? Pus or a bad smell coming from your episiotomy or vaginal tear. °? A fever. °? A rash. °? Little or no interest in activities you used to enjoy. °? Questions about caring for yourself or your baby. °· Your episiotomy or vaginal tear feels warm to the touch. °· Your episiotomy or vaginal tear is separating or does not appear to be healing. °· Your breasts are painful, hard, or turn red. °· You feel unusually sad or worried. °· You feel nauseous or you vomit. °· You pass large blood clots from your vagina. If you pass a blood clot from your vagina, save it to show to your health care provider. Do not flush blood clots down the toilet without having your health care provider look at them. °· You urinate more than usual. °· You are dizzy or light-headed. °· You have not breastfed at all and you have not had a menstrual period for 12 weeks after delivery. °· You have stopped breastfeeding and you have not had a menstrual period for 12 weeks after you stopped breastfeeding. °Get help right away if: °· You have: °? Pain that does not go away or does not get better with medicine. °? Chest pain. °? Difficulty breathing. °? Blurred vision or spots in your vision. °? Thoughts about hurting yourself or your baby. °· You develop pain in your abdomen or in one of your legs. °· You develop a severe headache. °· You faint. °· You bleed from your vagina so much that you fill two sanitary pads in one hour. °This information is not intended to replace advice given to you by your health care provider. Make sure you discuss any questions you have with your health care provider. °Document Released: 11/19/2000 Document Revised: 05/05/2016 Document Reviewed: 12/07/2015 °Elsevier Interactive Patient Education © 2019 [employer] ° °

## 2019-01-27 MED ORDER — OXYCODONE HCL 5 MG PO TABS: 5.0000 mg | ORAL_TABLET | ORAL | 0 refills | Status: DC | PRN

## 2019-01-27 MED ORDER — IBUPROFEN 600 MG PO TABS: 600.0000 mg | ORAL_TABLET | Freq: Four times a day (QID) | ORAL | 0 refills | Status: DC | PRN

## 2019-01-27 NOTE — Progress Notes (Signed)
Patient screened out for psychosocial assessment since none of the following apply: °Psychosocial stressors documented in mother or baby's chart °Gestation less than 32 weeks °Code at delivery  °Infant with anomalies °Please contact the Clinical Social Worker if specific needs arise, by MOB's request, or if MOB scores greater than 9/yes to question 10 on Edinburgh Postpartum Depression Screen. ° °Cadon Raczka Boyd-Gilyard, MSW, LCSW °Clinical Social Work °[phone] °  °

## 2019-01-27 NOTE — Lactation Note (Signed)
This note was copied from a baby's chart. Lactation Consultation Note: Follow up visit with this mom of NICU baby born at 32 w 3 d. Mom reports she pumped at least 6 times yesterday. Reports breasts are feeling a little fuller this morning and she obtained a little more milk during the night. Has pump from [facility] for home. Reviewed bringing her pump pieces to hospital when visiting baby and can pump while she is here, Praise given for her efforts. Reviewed engorgement prevention and treatment. No questions at present. Reviewed our phone number to call with questions/concerns or if needs assist when ready to put baby to the breast. To call prn  Patient Name: Jill Mullen [id number] Date: 01/27/2019 Reason for consult: Follow-up assessment;NICU baby   Maternal Data Formula Feeding for Exclusion: Yes Reason for exclusion: Admission to Intensive Care Unit (ICU) post-partum Has patient been taught Hand Expression?: Yes Does the patient have breastfeeding experience prior to this delivery?: Yes  Feeding Feeding Type: Breast Milk with Formula added  LATCH Score                   Interventions    Lactation Tools Discussed/Used WIC Program: Yes   Consult Status Consult Status: Complete    Pamelia Hoit 01/27/2019, 9:12 AM

## 2019-01-28 ENCOUNTER — Ambulatory Visit: Payer: Self-pay

## 2019-01-28 NOTE — Lactation Note (Addendum)
This note was copied from a baby's chart. Lactation Consultation Note  Patient Name: Jill Mullen [id number] Date: 01/28/2019 Reason for consult: Follow-up assessment    RN requested LC at bedside in NICU to assist with breastfeeding attempt (per mom's request).    Mom is pumping and getting approx. 25 ml per pump.  Mom is excited to be able to have a room and plans to stay with infant overnight.  Her desire is to get him to the breast more often for feeds and ultimately exclusively breastfeed him.      LC provided pillows for better positioning and rolled blankets for infant head/neck support.  Mom used football hold for left side.  Milk was hand expressed and infant opened wide to latch then fell asleep after latching.  Mom has wide, shorter shafted nipples.  LC provided #24 NS and had mom latch infant.  Latch achieved with wide gape, a few sucks then infant fell asleep.  With massage and breast compression, infant began rhythmically sucking.  LC removed the NS and infant transferred and latched without NS easily.  Approximately 7 sucks taken then infant fell asleep.    Mom wanted to try the other side which she positioned infant in cross cradle.  Infant opened widely to latch but then fell asleep shortly after taking a few sucks.    LC encouraged mom to try to bf without the NS then if infant was unable to sustain a latch.  Mom knows how to apply NS and clean.   LC encouraged mom to do plenty of STS and offer the breast with scheduled feeds if infant shows cues.  Infant is scheduled q 3 hour feeds.  Mom desires for Lactation to come to assist with a feed daily for breastfeeding support.  Currently, infant is receiving tube feeding but has taken milk from a bottle previsouly.  An extra basin was provided for mom to dry her supplies along with extra bottles.  Mom is pumping every 3 hours and every 4 when sleeping.  Mom was encouraged to call back for further questions or assistance with  feeds.     Maternal Data Reason for exclusion: Admission to Intensive Care Unit (ICU) post-partum  Feeding Feeding Type: Formula Nipple Type: Nfant Slow Flow (purple)  LATCH Score Latch: Repeated attempts needed to sustain latch, nipple held in mouth throughout feeding, stimulation needed to elicit sucking reflex.  Audible Swallowing: None  Type of Nipple: Everted at rest and after stimulation  Comfort (Breast/Nipple): Soft / non-tender(mom does state her breast feel more full than previously felt)  Hold (Positioning): Assistance needed to correctly position infant at breast and maintain latch.  LATCH Score: 6  Interventions Interventions: Breast feeding basics reviewed;Assisted with latch;Skin to skin;Breast massage;Hand express;Support pillows;Adjust position;Position options;DEBP  Lactation Tools Discussed/Used Tools: Nipple Shields Nipple shield size: 24   Consult Status Consult Status: Follow-up Date: 01/29/19 Follow-up type: In-patient    Maryruth Hancock [facility] 01/28/2019, 6:39 PM

## 2019-01-29 ENCOUNTER — Ambulatory Visit: Payer: Self-pay

## 2019-01-29 NOTE — Lactation Note (Signed)
This note was copied from a baby's chart. Lactation Consultation Note  Patient Name: Jill Mullen [id number] Date: 01/29/2019  Mom reports they breastfed earlier and it was the best yet. Mom reports she would like someone to watch them breastfeed a few more times before they go home.  Infant content past breastfeed.  Urged mom to follow up with lactation as needed.  Maternal Data    Feeding Feeding Type: Breast Milk  LATCH Score Latch: Grasps breast easily, tongue down, lips flanged, rhythmical sucking.  Audible Swallowing: Spontaneous and intermittent  Type of Nipple: Everted at rest and after stimulation  Comfort (Breast/Nipple): Soft / non-tender  Hold (Positioning): No assistance needed to correctly position infant at breast.  LATCH Score: 10  Interventions    Lactation Tools Discussed/Used     Consult Status      Neomia Dear 01/29/2019, 10:36 PM

## 2019-01-31 ENCOUNTER — Telehealth (HOSPITAL_COMMUNITY): Payer: Self-pay | Admitting: Lactation Services

## 2019-01-31 NOTE — Telephone Encounter (Signed)
Fax received to refill prescription for Ranitidine 150 mg tabs BID. Refill request was declined by Noreene Larsson, PA as medication has been recalled.

## 2019-02-01 ENCOUNTER — Emergency Department (HOSPITAL_COMMUNITY)
Admission: EM | Admit: 2019-02-01 | Discharge: 2019-02-01 | Discharge status: Against Medical Advice | Payer: Medicaid Other | Attending: Emergency Medicine | Admitting: Emergency Medicine

## 2019-02-01 ENCOUNTER — Other Ambulatory Visit: Payer: Self-pay

## 2019-02-01 ENCOUNTER — Encounter (HOSPITAL_COMMUNITY): Payer: Self-pay | Admitting: Emergency Medicine

## 2019-02-01 DIAGNOSIS — Z5321 Procedure and treatment not carried out due to patient leaving prior to being seen by health care provider: Secondary | ICD-10-CM | POA: Diagnosis not present

## 2019-02-01 DIAGNOSIS — R600 Localized edema: Secondary | ICD-10-CM | POA: Diagnosis present

## 2019-02-01 NOTE — ED Notes (Signed)
Weight: 319.3lbs

## 2019-02-01 NOTE — ED Triage Notes (Signed)
Patient reports that she is 7 days post partum. She reports that she has swelling in her hands and feet.

## 2019-02-01 NOTE — ED Notes (Signed)
This RN went into patient's room to find patient has left. Pt and all belongings are gone from room. ED Provider made aware by this RN. Pt left AMA.

## 2019-02-02 NOTE — ED Provider Notes (Cosign Needed)
[facility] EMERGENCY DEPARTMENT Provider Note   CSN: [id number] Arrival date & time: 02/01/19  1033    History   Chief Complaint No chief complaint on file.   HPI Jill Mullen is a 33 y.o. female.     HPI Patient presents to the emergency department with swelling in her feet since she had her baby.  The patient states that was 7 days ago.  The patient states this started before she was discharged after having her baby.  Patient states that nothing seems to make the condition better or worse.  Patient states that she wants [place] to put her on a fluid pill.  The patient denies chest pain, shortness of breath, headache,blurred vision, neck pain, fever, cough, weakness, numbness, dizziness, anorexia,abdominal pain, nausea, vomiting, diarrhea, rash, back pain, dysuria, hematemesis, bloody stool, near syncope, or syncope. Past Medical History:  Diagnosis Date  . Hypertension   . Medical history non-contributory   . Pregnancy induced hypertension   . Vaginal Pap smear, abnormal 2005    Patient Active Problem List   Diagnosis Date Noted  . Morbid obesity (HCC) 01/24/2019  . Oligohydramnios 01/24/2019  . Group B streptococcal infection during pregnancy 01/22/2019  . Supervision of high risk pregnancy, antepartum 10/13/2018  . Previous preterm delivery, antepartum 10/13/2018  . Tobacco use 03/23/2014    Past Surgical History:  Procedure Laterality Date  . TONSILLECTOMY       OB History    Gravida  4   Para  3   Term  2   Preterm  1   AB  1   Living  3     SAB  1   TAB      Ectopic      Multiple  0   Live Births  3            Home Medications    Prior to Admission medications   Medication Sig Start Date End Date Taking? Authorizing Provider  ASPIRIN 81 PO Take by mouth.    [provider]  ibuprofen (ADVIL,MOTRIN) 600 MG tablet Take 1 tablet (600 mg total) by mouth every 6 (six) hours as needed. 01/27/19   Cam Hai  D, CNM  oxyCODONE (OXY IR/ROXICODONE) 5 MG immediate release tablet Take 1 tablet (5 mg total) by mouth every 4 (four) hours as needed (pain scale 4-7). 01/27/19   Arabella Merles, CNM  Prenatal Multivit-Min-Fe-FA (PRENATAL VITAMINS PO) Take by mouth.    [provider]    Family History Family History  Problem Relation Age of Onset  . Depression Mother   . Obesity Mother   . Drug abuse Father   . Obesity Father   . ADD / ADHD Sister   . ADD / ADHD Brother   . ADD / ADHD Daughter   . Diabetes Daughter   . Intellectual disability Daughter   . Learning disabilities Daughter   . ADD / ADHD Son   . Arthritis Maternal Grandmother   . Cancer Maternal Grandmother   . Cancer Maternal Grandfather     Social History Social History   Tobacco Use  . Smoking status: Never Smoker  . Smokeless tobacco: Never Used  Substance Use Topics  . Alcohol use: Not Currently  . Drug use: Yes    Types: Marijuana     Allergies   Penicillins   Review of Systems Review of Systems All other systems negative except as documented in the HPI. All pertinent positives and  negatives as reviewed in the HPI.  Physical Exam Updated Vital Signs BP 111/76   Pulse 86   Temp 98.4 F (36.9 C) (Oral)   Resp 18   Wt (!) 144.8 kg   LMP 05/08/2018   SpO2 99%   BMI 47.14 kg/m   Physical Exam Vitals signs and nursing note reviewed.  Constitutional:      General: She is not in acute distress.    Appearance: She is well-developed.  HENT:     Head: Normocephalic and atraumatic.  Eyes:     Pupils: Pupils are equal, round, and reactive to light.  Neck:     Musculoskeletal: Normal range of motion and neck supple.  Cardiovascular:     Rate and Rhythm: Normal rate and regular rhythm.     Heart sounds: Normal heart sounds. No murmur. No friction rub. No gallop.   Pulmonary:     Effort: Pulmonary effort is normal. No respiratory distress.     Breath sounds: Normal breath sounds. No wheezing.    Abdominal:     General: Bowel sounds are normal. There is no distension.     Palpations: Abdomen is soft.     Tenderness: There is no abdominal tenderness.  Musculoskeletal:     Right lower leg: Edema present.     Left lower leg: Edema present.     Comments: Patient has 1+ edema to her feet and ankles.  Skin:    General: Skin is warm and dry.     Capillary Refill: Capillary refill takes less than 2 seconds.     Findings: No erythema or rash.  Neurological:     Mental Status: She is alert and oriented to person, place, and time.     Motor: No abnormal muscle tone.     Coordination: Coordination normal.  Psychiatric:        Behavior: Behavior normal.      ED Treatments / Results  Labs (all labs ordered are listed, but only abnormal results are displayed) Labs Reviewed - No data to display  EKG None  Radiology No results found.  Procedures Procedures (including critical care time)  Medications Ordered in ED Medications - No data to display   Initial Impression / Assessment and Plan / ED Course  I have reviewed the triage vital signs and the nursing notes.  Pertinent labs & imaging results that were available during my care of the patient were reviewed by me and considered in my medical decision making (see chart for details).        Patient has had mild swelling.  The patient does not know if she would like to have any laboratory testing at this time and would like to give some thought to it for about 5 minutes.  She states she feels fine and does not think testing is necessary.  Final Clinical Impressions(s) / ED Diagnoses   Final diagnoses:  None    ED Discharge Orders    None       Charlestine Night, [place] 02/02/19 1536

## 2019-02-23 ENCOUNTER — Ambulatory Visit: Payer: Self-pay | Admitting: Advanced Practice Midwife

## 2019-03-14 ENCOUNTER — Ambulatory Visit: Payer: Self-pay | Admitting: Medical

## 2019-03-28 ENCOUNTER — Ambulatory Visit (INDEPENDENT_AMBULATORY_CARE_PROVIDER_SITE_OTHER): Payer: Medicaid Other | Admitting: Obstetrics & Gynecology

## 2019-03-28 ENCOUNTER — Other Ambulatory Visit: Payer: Self-pay

## 2019-03-28 DIAGNOSIS — Z1389 Encounter for screening for other disorder: Secondary | ICD-10-CM

## 2019-03-28 MED ORDER — NORETHINDRONE 0.35 MG PO TABS: 1.0000 | ORAL_TABLET | Freq: Every day | ORAL | 11 refills | Status: DC

## 2019-03-28 NOTE — Progress Notes (Signed)
  I connected with on at  EDT by telephone at home and verified that I am speaking with the correct person using two identifiers.   I discussed the limitations, risks, security and privacy concerns of performing an evaluation and management service by telephone and the availability of in person appointments. I also discussed with the patient that there may be a patient responsible charge related to this service. The patient expressed understanding and agreed to proceed.     I discussed the assessment and treatment plan with the patient. The patient was provided an opportunity to ask questions and all were answered. The patient agreed with the plan and demonstrated an understanding of the instructions.   The patient was advised to call back or seek an in-person evaluation/go to the ED if the symptoms worsen or if the condition fails to improve as anticipated.  I provided 10 minutes of non-face-to-face time during this encounter.   Allie Bossier, MD [facility], [facility]  Carolin Coy is a 33 y.o. P3 (who presents for a postpartum visit. She is 6 weeks postpartum following a SVD.  I have fully reviewed the prenatal and intrapartum course. The delivery was at 37 gestational weeks following IOL for oligohydramnios. Postpartum course has been normal. Baby's course has been normal. Baby is feeding by breast. She is having  Some bleeding now, thinking it her period. Bowel function is normal.   Bladder function is normal. Patient is sexually active. Contraception method is withdrawal. She would like a pill.   Postpartum depression screening: negative    Objective:    There were no vitals taken for this visit.  Pap normal 10/19.     Assessment:     Plan:    Postpartum state: doing well   Contraception:  start micronor. She had the Mirena in the past and had a lot of cramping.  Rec back up for 4 weeks

## 2020-03-27 ENCOUNTER — Other Ambulatory Visit (HOSPITAL_COMMUNITY)
Admission: RE | Admit: 2020-03-27 | Discharge: 2020-03-27 | Disposition: A | Discharge status: Home / Self Care | Payer: Medicaid Other | Source: Ambulatory Visit | Attending: Obstetrics & Gynecology | Admitting: Obstetrics & Gynecology

## 2020-03-27 ENCOUNTER — Other Ambulatory Visit: Payer: Self-pay

## 2020-03-27 ENCOUNTER — Ambulatory Visit (INDEPENDENT_AMBULATORY_CARE_PROVIDER_SITE_OTHER): Payer: Medicaid Other | Admitting: *Deleted

## 2020-03-27 DIAGNOSIS — N898 Other specified noninflammatory disorders of vagina: Secondary | ICD-10-CM | POA: Diagnosis present

## 2020-03-27 MED ORDER — FLUCONAZOLE 150 MG PO TABS: 150.0000 mg | ORAL_TABLET | Freq: Once | ORAL | 0 refills | Status: AC

## 2020-03-27 NOTE — Progress Notes (Signed)
Here for self swab. States had a boil that she put antibiotic cream and it burst. States she feel like maybe some of the discharge got in her vagina. States boil is better now. C/o vaginal itching and thick white vaginal discharge. Discussed and will do self swab for GC, Trich, BV and yeast as she does have unprotected intercourse with only 1 partner. I advised her if the boil comes back to schedule visit because she reports she has had boils before.  Legrand Como

## 2020-03-28 LAB — CERVICOVAGINAL ANCILLARY ONLY
Bacterial Vaginitis (gardnerella): NEGATIVE
Candida Glabrata: NEGATIVE
Candida Vaginitis: NEGATIVE
Chlamydia: NEGATIVE
Comment: NEGATIVE
Comment: NEGATIVE
Comment: NEGATIVE
Comment: NEGATIVE
Comment: NEGATIVE
Comment: NORMAL
Neisseria Gonorrhea: NEGATIVE
Trichomonas: NEGATIVE

## 2020-03-30 ENCOUNTER — Telehealth: Payer: Medicaid Other | Admitting: Family

## 2020-03-30 DIAGNOSIS — N898 Other specified noninflammatory disorders of vagina: Secondary | ICD-10-CM

## 2020-03-30 MED ORDER — FLUCONAZOLE 150 MG PO TABS: 150.0000 mg | ORAL_TABLET | ORAL | 0 refills | Status: DC | PRN

## 2020-03-30 NOTE — Progress Notes (Signed)
Patient ID: Jill Mullen, female   DOB: 09/13/1986, 34 y.o.   MRN: [id number] Patient seen and assessed by nursing staff during this encounter. I have reviewed the chart and agree with the documentation and plan. I have also made any necessary editorial changes.  Scheryl Darter, MD 03/30/2020 10:56 AM

## 2020-03-30 NOTE — Progress Notes (Signed)

## 2020-09-12 ENCOUNTER — Telehealth (INDEPENDENT_AMBULATORY_CARE_PROVIDER_SITE_OTHER): Payer: Medicaid Other | Admitting: *Deleted

## 2020-09-12 ENCOUNTER — Other Ambulatory Visit: Payer: Self-pay

## 2020-09-12 ENCOUNTER — Encounter: Payer: Self-pay | Admitting: *Deleted

## 2020-09-12 DIAGNOSIS — Z8759 Personal history of other complications of pregnancy, childbirth and the puerperium: Secondary | ICD-10-CM | POA: Insufficient documentation

## 2020-09-12 DIAGNOSIS — I10 Essential (primary) hypertension: Secondary | ICD-10-CM

## 2020-09-12 DIAGNOSIS — O10919 Unspecified pre-existing hypertension complicating pregnancy, unspecified trimester: Secondary | ICD-10-CM | POA: Insufficient documentation

## 2020-09-12 DIAGNOSIS — O219 Vomiting of pregnancy, unspecified: Secondary | ICD-10-CM

## 2020-09-12 DIAGNOSIS — O0990 Supervision of high risk pregnancy, unspecified, unspecified trimester: Secondary | ICD-10-CM

## 2020-09-12 DIAGNOSIS — O09899 Supervision of other high risk pregnancies, unspecified trimester: Secondary | ICD-10-CM

## 2020-09-12 MED ORDER — PRENATAL 27-0.8 MG PO TABS: 1.0000 | ORAL_TABLET | Freq: Every day | ORAL | 11 refills | Status: DC

## 2020-09-12 MED ORDER — DOXYLAMINE-PYRIDOXINE 10-10 MG PO TBEC: 2.0000 | DELAYED_RELEASE_TABLET | Freq: Every day | ORAL | 2 refills | Status: DC

## 2020-09-12 MED ORDER — BLOOD PRESSURE KIT DEVI: 1.0000 | 0 refills | Status: AC | PRN

## 2020-09-12 NOTE — Patient Instructions (Signed)
  At [facility], we work as an integrated team, providing care to address both physical and emotional health. Your medical provider may refer you to see our Behavioral Health Clinician (BHC) on the same day you see your medical provider, as availability permits.  Our BHC is available to all patients, visits generally last between 20-30 minutes, but can be longer or shorter, depending on patient need. The BHC offers help with stress management, coping with symptoms of depression and anxiety, major life changes , sleep issues, changing risky behavior, grief and loss, life stress, working on personal life goals, and  behavioral health issues, as these all affect your overall health and wellness.  The BHC is NOT available for the following: FMLA paperwork, court-ordered evaluations, specialty assessments (custody or disability), letters to employers, or obtaining certification for an emotional support animal. The BHC does not provide long-term therapy. You have the right to refuse integrated behavioral health services, or to reschedule to see the BHC at a later date.  Exception: If you are having thoughts of suicide, we require that you either see the BHC for further assessment, or contract for safety with your medical provider. Confidentiality exception: If it is suspected that a child or disabled adult is being abused or neglected, we are required by law to report that to either Child Protective Services or Adult Protective Services.  If you have a diagnosis of Bipolar affective disorder, Schizophrenia, or recurrent Major depressive disorder, we will recommend that you establish care with a psychiatrist, as these are lifelong, chronic conditions, and we want your overall emotional health and medications to be more closely monitored. If you anticipate needing extended maternity leave due to mental illness, it it recommended you inform your medical provider, so  we can put in a referral to a  psychiatrist as soon as possible. The BHC is unable to recommend an extended maternity leave for mental health issues. Your medical provider or BHC may refer you to a therapist for ongoing, traditional therapy, or to a psychiatrist, for medication management, if it would benefit your overall health. Depending on your insurance, you may have a copay to see the BHC. If you are uninsured, it is recommended that you apply for financial assistance. (Forms may be requested at the front desk for in-person visits, via MyChart, or request a form during a virtual visit).  If you see the BHC more than 6 times, you will have to complete a comprehensive clinical assessment interview with the BHC to resume integrated services.  For virtual visits with the BHC, you must be physically in the state of [place] at the time of the visit. For example, if you live in [place], you will have to do an in-person visit with the BHC. If you are going out of the state or country for any reason, the BHC may see you virtually when you return to [place], but not while you are physically outside of [place].    

## 2020-09-12 NOTE — Progress Notes (Signed)
Jill Mullen not connected virtually her her appointment . I called Jill Mullen and informed her I am calling about her appointment and that it is virtual. I asked if she can connect with me virtually and she reports she is trying to do that and has done it before. I reviewed how to connect .  Jill Sheaffer,RN  New OB Intake  I connected with  Jill Mullen on 09/12/20 at 9:30 by MyChart and verified that I am speaking with the correct person using two identifiers. Nurse is located at [facility] and pt is located at home.  I discussed the limitations, risks, security and privacy concerns of performing an evaluation and management service by telephone and the availability of in person appointments. I also discussed with the patient that there may be a patient responsible charge related to this service. The patient expressed understanding and agreed to proceed.  I explained I am completing New OB Intake today. We discussed her EDD of 04/05/2021 that is based on LMP of 06/29/20. Pt is G5/P2113. I reviewed her allergies, medications, /Medical/Surgical/OB history, and appropriate screenings. I informed her of [facility] services. Based on history, this is a/an complicated by history of preeclampisa or GHTN/ now HTN  Also hs PTD at 36 weeks.   Concerns addressed today  WIC Would like wic referral, referral sent.   MyChart/Babyscripts MyChart access verified. I explained pt will have some visits in office and some virtually. Babyscripts instructions given.  Blood Pressure Cuff Blood pressure cuff ordered for patient to pick-up from [employer]. Explained after first prenatal appt pt will check weekly and document in Babyscripts.  Anatomy US Explained first scheduled [place] will be around 19 weeks. Anatomy US scheduled for 11/10/20 at 1045. Pt notified to arrive at 1030.  Labs Discussed Jill Mullen genetic screening with patient. Would like both Panorama and Horizon drawn at new OB visit. Routine prenatal labs  needed.  First visit review I reviewed new OB appt with pt. I explained she will have a pelvic exam, ob bloodwork with genetic screening, . Explained pt will be seen by Dr.Davis at first visit; encounter routed to appropriate provider.  Meds:  Jill Mullen having morning sickness unreleived by [facility] and Vitamin B6. Would like other rx. Reviewed choices per protocol and she elected [facility]. [facility] RX sent in.   Jill Schabel,RN 09/12/2020  9:31 AM

## 2020-09-16 NOTE — Progress Notes (Signed)
I have reviewed this chart and agree with the RN/CMA assessment and management.    K. Meryl Avangelina Flight, M.D. Attending [facility] (Faculty Practice)   

## 2020-10-01 ENCOUNTER — Encounter: Payer: Medicaid Other | Admitting: Obstetrics and Gynecology

## 2020-10-01 ENCOUNTER — Encounter: Payer: Medicaid Other | Admitting: Nurse Practitioner

## 2020-10-21 NOTE — BH Specialist Note (Signed)
Integrated Behavioral Health Initial Visit  MRN: [id number] Name: Jill Mullen  Number of Integrated Behavioral Health Clinician visits:: 1/6 Session Start time: 3:20  Session End time: 3:50 Total time: 30  Type of Service: Integrated Behavioral Health- Individual/Family Interpretor:No. Interpretor Name and Language: n/a   Warm Hand Off Completed.       SUBJECTIVE: Jill Mullen is a 34 y.o. female accompanied by n/a Patient was referred by Mariel Aloe, MD for life stress Patient reports the following symptoms/concerns: Pt states her primary concern today is feeling overwhelmed and emotional/angry, attributed to current pregnancy and ongoing life stress in the past year.  Duration of problem: Increase in current pregnancy; Severity of problem: moderately severe  OBJECTIVE: Mood: Anxious and Affect: Appropriate Risk of harm to self or others: No plan to harm self or others  LIFE CONTEXT: Family and Social: Pt lives with her husband and children (14,13,9,2) School/Work: - Self-Care: Recognizing a greater need for self-care Life Changes: Current pregnancy, after a period of homelessness and financial stress in the past year  GOALS ADDRESSED: Patient will: 1. Reduce symptoms of: anxiety, depression, mood instability and stress 2. Increase knowledge and/or ability of: healthy habits, self-management skills and stress reduction  3. Demonstrate ability to: Increase healthy adjustment to current life circumstances and Increase adequate support systems for patient/family  INTERVENTIONS: Interventions utilized: Solution-Focused Strategies and Psychoeducation and/or Health Education  Standardized Assessments completed: GAD-7 and PHQ 9  ASSESSMENT: Patient currently experiencing Adjustment disorder with mixed anxiety and depression and Psychosocial stress.   Patient may benefit from psychoeducation and brief therapeutic interventions regarding coping with symptoms of  anxiety, depression, and life stress .  PLAN: 1. Follow up with behavioral health clinician on : Two weeks 2. Behavioral recommendations:  -CALM relaxation breathing exercise twice daily (morning; at bedtime) for next two weeks -Continue taking prenatal vitamin as recommended by medical provider -Take home food from [employer] today -Consider additional community resources (on After visit Summary today) -Consider apps (listed on After Visit Summary today) to use as additional self-care  3. Referral(s): Integrated [facility] (In Clinic) and [facility]:  Food, Housing and Transportation  [address] [place], [place]   Depression screen [facility] 2/9 10/22/2020 09/12/2020 03/27/2020 01/16/2019 01/02/2019  Decreased Interest 2 1 1  0 1  Down, Depressed, Hopeless 3 1 3  0 0  PHQ - 2 Score 5 2 4  0 1  Altered sleeping 2 0 3 1 1   Tired, decreased energy 3 0 2 2 2   Change in appetite 2 0 2 0 1  Feeling bad or failure about yourself  3 0 1 0 1  Trouble concentrating 1 0 1 0 0  Moving slowly or fidgety/restless 0 0 0 1 0  Suicidal thoughts 0 0 0 0 0  PHQ-9 Score 16 2 13 4 6    GAD 7 : Generalized Anxiety Score 10/22/2020 09/12/2020 03/27/2020 01/16/2019  Nervous, Anxious, on Edge 1 1 2  0  Control/stop worrying 1 0 1 0  Worry too much - different things 1 0 1 1  Trouble relaxing 1 1 1  0  Restless 1 0 0 0  Easily annoyed or irritable 1 1 2  0  Afraid - awful might happen 0 0 0 0  Total GAD 7 Score 6 3 7  1

## 2020-10-22 ENCOUNTER — Ambulatory Visit (INDEPENDENT_AMBULATORY_CARE_PROVIDER_SITE_OTHER): Payer: Medicaid Other | Admitting: Obstetrics and Gynecology

## 2020-10-22 ENCOUNTER — Other Ambulatory Visit: Payer: Self-pay

## 2020-10-22 ENCOUNTER — Ambulatory Visit (INDEPENDENT_AMBULATORY_CARE_PROVIDER_SITE_OTHER): Payer: Medicaid Other | Admitting: Clinical

## 2020-10-22 ENCOUNTER — Other Ambulatory Visit (HOSPITAL_COMMUNITY)
Admission: RE | Admit: 2020-10-22 | Discharge: 2020-10-22 | Disposition: A | Discharge status: Home / Self Care | Payer: Medicaid Other | Source: Ambulatory Visit | Attending: Obstetrics and Gynecology | Admitting: Obstetrics and Gynecology

## 2020-10-22 DIAGNOSIS — O0990 Supervision of high risk pregnancy, unspecified, unspecified trimester: Secondary | ICD-10-CM | POA: Insufficient documentation

## 2020-10-22 DIAGNOSIS — Z3A16 16 weeks gestation of pregnancy: Secondary | ICD-10-CM | POA: Insufficient documentation

## 2020-10-22 DIAGNOSIS — O219 Vomiting of pregnancy, unspecified: Secondary | ICD-10-CM

## 2020-10-22 DIAGNOSIS — O09899 Supervision of other high risk pregnancies, unspecified trimester: Secondary | ICD-10-CM | POA: Diagnosis present

## 2020-10-22 DIAGNOSIS — O09299 Supervision of pregnancy with other poor reproductive or obstetric history, unspecified trimester: Secondary | ICD-10-CM | POA: Insufficient documentation

## 2020-10-22 DIAGNOSIS — F4323 Adjustment disorder with mixed anxiety and depressed mood: Secondary | ICD-10-CM | POA: Diagnosis not present

## 2020-10-22 DIAGNOSIS — Z6841 Body Mass Index (BMI) 40.0 and over, adult: Secondary | ICD-10-CM

## 2020-10-22 DIAGNOSIS — Z8759 Personal history of other complications of pregnancy, childbirth and the puerperium: Secondary | ICD-10-CM

## 2020-10-22 MED ORDER — ASPIRIN 81 MG PO CHEW: 81.0000 mg | CHEWABLE_TABLET | Freq: Every day | ORAL | 7 refills | Status: DC

## 2020-10-22 MED ORDER — PROMETHAZINE HCL 25 MG PO TABS: 25.0000 mg | ORAL_TABLET | Freq: Four times a day (QID) | ORAL | 1 refills | Status: DC | PRN

## 2020-10-22 NOTE — Patient Instructions (Signed)
Morning Sickness  Morning sickness is when you feel sick to your stomach (nauseous) during pregnancy. You may feel sick to your stomach and throw up (vomit). You may feel sick in the morning, but you can feel this way at any time of day. Some women feel very sick to their stomach and cannot stop throwing up (hyperemesis gravidarum). Follow these instructions at home: Medicines  Take over-the-counter and prescription medicines only as told by your doctor. Do not take any medicines until you talk with your doctor about them first.  Taking multivitamins before getting pregnant can stop or lessen the harshness of morning sickness. Eating and drinking  Eat dry toast or crackers before getting out of bed.  Eat 5 or 6 small meals a day.  Eat dry and bland foods like rice and baked potatoes.  Do not eat greasy, fatty, or spicy foods.  Have someone cook for you if the smell of food causes you to feel sick or throw up.  If you feel sick to your stomach after taking prenatal vitamins, take them at night or with a snack.  Eat protein when you need a snack. Nuts, yogurt, and cheese are good choices.  Drink fluids throughout the day.  Try ginger ale made with real ginger, ginger tea made from fresh grated ginger, or ginger candies. General instructions  Do not use any products that have nicotine or tobacco in them, such as cigarettes and e-cigarettes. If you need help quitting, ask your doctor.  Use an air purifier to keep the air in your house free of smells.  Get lots of fresh air.  Try to avoid smells that make you feel sick.  Try: ? Wearing a bracelet that is used for seasickness (acupressure wristband). ? Going to a doctor who puts thin needles into certain body points (acupuncture) to improve how you feel. Contact a doctor if:  You need medicine to feel better.  You feel dizzy or light-headed.  You are losing weight. Get help right away if:  You feel very sick to your  stomach and cannot stop throwing up.  You pass out (faint).  You have very bad pain in your belly. Summary  Morning sickness is when you feel sick to your stomach (nauseous) during pregnancy.  You may feel sick in the morning, but you can feel this way at any time of day.  Making some changes to what you eat may help your symptoms go away. This information is not intended to replace advice given to you by your health care provider. Make sure you discuss any questions you have with your health care provider. Document Revised: 11/04/2017 Document Reviewed: 12/23/2016 Elsevier Patient Education  2020 Else[employer] Inc.   Hypertension During Pregnancy Hypertension is also called high blood pressure. High blood pressure means that the force of your blood moving in your body is too strong. It can cause problems for you and your baby. Different types of high blood pressure can happen during pregnancy. The types are:  High blood pressure before you got pregnant. This is called chronic hypertension.  This can continue during your pregnancy. Your doctor will want to keep checking your blood pressure. You may need medicine to keep your blood pressure under control while you are pregnant. You will need follow-up visits after you have your baby.  High blood pressure that goes up during pregnancy when it was normal before. This is called gestational hypertension. It will usually get better after you have your baby, but   will need to watch your blood pressure to make sure that it is getting better.  Very high blood pressure during pregnancy. This is called preeclampsia. Very high blood pressure is an emergency that needs to be checked and treated right away.  You may develop very high blood pressure after giving birth. This is called postpartum preeclampsia. This usually occurs within 48 hours after childbirth but may occur up to 6 weeks after giving birth. This is rare. How does this affect me? If  you have high blood pressure during pregnancy, you have a higher chance of developing high blood pressure:  As you get older.  If you get pregnant again. In some cases, high blood pressure during pregnancy can cause:  Stroke.  Heart attack.  Damage to the kidneys, lungs, or liver.  Preeclampsia.  Jerky movements you cannot control (convulsions or seizures).  Problems with the placenta. How does this affect my baby? Your baby may:  Be born early.  Not weigh as much as he or she should.  Not handle labor well, leading to a c-section birth. What are the risks?  Having high blood pressure during a past pregnancy.  Being overweight.  Being 3 years old or older.  Being pregnant for the first time.  Being pregnant with more than one baby.  Becoming pregnant using fertility methods, such as IVF.  Having other problems, such as diabetes, or kidney disease.  Having family members who have high blood pressure. What can I do to lower my risk?   Keep a healthy weight.  Eat a healthy diet.  Follow what your doctor tells you about treating any medical problems that you had before becoming pregnant. It is very important to go to all of your doctor visits. Your doctor will check your blood pressure and make sure that your pregnancy is progressing as it should. Treatment should start early if a problem is found. How is this treated? Treatment for high blood pressure during pregnancy can differ depending on the type of high blood pressure you have and how serious it is.  You may need to take blood pressure medicine.  If you have been taking medicine for your blood pressure, you may need to change the medicine during pregnancy if it is not safe for your baby.  If your doctor thinks that you could get very high blood pressure, he or she may tell you to take a low-dose aspirin during your pregnancy.  If you have very high blood pressure, you may need to stay in the hospital so  you and your baby can be watched closely. You may also need to take medicine to lower your blood pressure. This medicine may be given by mouth or through an IV tube.  In some cases, if your condition gets worse, you may need to have your baby early. Follow these instructions at home: Eating and drinking   Drink enough fluid to keep your pee (urine) pale yellow.  Avoid caffeine. Lifestyle  Do not use any products that contain nicotine or tobacco, such as cigarettes, e-cigarettes, and chewing tobacco. If you need help quitting, ask your doctor.  Do not use alcohol or drugs.  Avoid stress.  Rest and get plenty of sleep.  Regular exercise can help. Ask your doctor what kinds of exercise are best for you. General instructions  Take over-the-counter and prescription medicines only as told by your doctor.  Keep all prenatal and follow-up visits as told by your doctor. This is important. Contact  important. Contact a doctor if:  You have symptoms that your doctor told you to watch for, such as: ? Headaches. ? Nausea. ? Vomiting. ? Belly (abdominal) pain. ? Dizziness. ? Light-headedness. Get help right away if:  You have: ? Very bad belly pain that does not get better with treatment. ? A very bad headache that does not get better. ? Vomiting that does not get better. ? Sudden, fast weight gain. ? Sudden swelling in your hands, ankles, or face. ? Bleeding from your vagina. ? Blood in your pee. ? Blurry vision. ? Double vision. ? Shortness of breath. ? Chest pain. ? Weakness on one side of your body. ? Trouble talking.  Your baby is not moving as much as usual. Summary  High blood pressure is also called hypertension.  High blood pressure means that the force of your blood moving in your body is too strong.  High blood pressure can cause problems for you and your baby.  Keep all follow-up visits as told by your doctor. This is important. This information is not intended to  replace advice given to you by your health care provider. Make sure you discuss any questions you have with your health care provider. Document Revised: 03/15/2019 Document Reviewed: 12/19/2018 Elsevier Patient Education  2020 Elsevier Inc.  

## 2020-10-22 NOTE — Patient Instructions (Signed)
Center for [facility] [address] [place], [place] [address] [phone] (main office) [phone] [facility](Aliviya Schoeller's office)  Www.conehealthybaby.com   Housing Resources                    MeadWestv[facility] (serves [place], [place], [place], [place], [place], [place], [place], [place], [place], [place], [place], [place], and [place] counties) [address], [place], [place] [address] [phone] DeveloperU.chwww.ptrc.org  **Rental assistance, Home Rehabilitation,Weatherization Assistance Program, Chief Financial OfficerHeating Appliance Repair and Replacement Program, Housing Voucher Program   Housing Resources [facility]  Housing Authority- [place] [address], [place], [place] [address] [phone] www.gha-Grandview.[facility] Medical Centerorg   [place] [facility] Coalition [address], [place], [place] [address] [phone] PhoneCaptions.chwww.gsohc.org **Programs include: Hospital doctororeclosure Prevention and Housing Counseling, Healthy Radiographer, therapeuticHomes/Tenant Advocacy, Homeless Prevention and Housing Assistance  Government [facility] [address], [address], [place], [place] [address] [phone] www.PaintingEmporium.co.zaguilfordcountync.gov **housing applications/recertification; tax payment relief/exemption under specific qualifications  [facility] [address], [place], [place] [address] www.onlinegreensboro.com/~maryshouse **transitional housing for women in recovery who have minor children or are pregnant  [facility] [address], [place], [place] [address] https://johnson-smith.net/www.ywcagsonc.org  **emergency shelter and support services for families facing homelessness  Youth Focus [address], [place], [place] [address] [phone] www.youthfocus.org **transitional housing to pregnant women; emergency housing for youth who have run away, are experiencing a family crisis, are victims of abuse or neglect, or are homeless  [facility] [address], [place], [place] [address] [phone] ircgso.org **Drop-in center for people experiencing homelessness; overnight warming center when temperature is 25 degrees or below  Re-Entry Staffing [address], [place], [place] [address] [phone] https://reentrystaffingagency.org/ **help with affordable housing to people experiencing homelessness or unemployment due to incarceration  [facility] [address], [place], [place] [address] [phone] www.greensborourbanministry.org  **emergency and transitional housing, rent/mortgage assistance, utility assistance  [facility] [address], [place], [place] [address] [phone] www.salvationarmyofgreensboro.org **emergency and transitional housing  Habitat for CenterPoint EnergyHumanity-[place] [address], [place], [place] [address] [phone] Www.habitatgreensboro.[facility] Hospitalorg   National Oilwell VarcoCommunity Housing Solutions [address], [place], [place] [address] [phone] https://chshousing.org **Home Ownership/Affordable Housing Program and [facility] LLCome Repair Program  Housing Consultants Group [address], [place], [place] [address] [phone] PaidValue.com.cywww.housingconsultantsgroup.org **home buyer education courses, foreclosure prevention  [facility] DHHS-Environmental Health [address], [place], [place] [address] [phone] DefMagazine.ishttp://eh.guilfordcountync.gov **Environmental Exposure Assessment (investigation of homes where either children or pregnant women with a confirmed elevated blood lead level reside)  [facility] of Vocational Rehabilitation-[place] [address] Nat MathUnit A, [place], [place] [address] [phone] ShowReturn.cawww.ncdhhs.gov/divisions/dvrs **Home Expense Assistance/Repairs Program; offers home accessibility updates, such as ramps or bars in the bathroom  Self-Help Credit Union[facility] [address],  [place], [place] [address] [phone]) [phone] https://www.self-help.org/locations/Owaneco-branch **Offers credit-building and banking services to people unable to use traditional banking   Housing Resources [facility] [place]  Housing Authority- [place] Rivers[place]ity of [facility] [place] [address], [place], [place] [address] [phone] ChatRepair.plwww.hpha.net   [facility] [address], [place], [place] [address] [phone]  WrestlingMonthly.plwww.salvationarmycarolinas.org/highpoint **housing applications/recertification, emergency and transitional housing  Open Golden West FinancialDoor Ministries of Colgate-PalmoliveHigh [place] [address], [place], [place] [address] [phone] www.odm-hp.org  **emergency and permanent housing; rent/mortgage payment assistance  Habitat for Schering-PloughHumanity-[place], [place] and [place] [address], [place], [place] [address] [phone]) [phone] https://russell-walls.com/www.habitathp.org  Family Services of the [facility],  [place] [address] [place], [place], [place] [address] www.familyservice-piedmont.org **emergency shelter for victims of domestic violence and sexual assault  Senior Resources-[place] [address] [place], [place] [address] [phone] www.senior-resources-[place].org **Home expense assistance/repairs for older adults  Housing Resources [facility] of [place] [address] [place], [place] [address] [phone] http://cohen-reilly.biz/  **May offer help with minor housing repairs  [facility]ries [address], [place], [place] [address] [phone] **emergency housing for victims of domestic violence  Housing Resources [place], [place], [place] Oaks Post-Acute)  [facility] [address] [place], [place] [address] [phone] www.newrha.[facility] [address] [place], [place] [address] [phone]  [facility] [address], [place], [place]  [address] [phone] www.co.[place].Juliustown.us **Housing applications/recertification; tax payment relief/exemption w specific qualifications  [facility] Help for Homeless [address], [place], [place] [address] [phone] Http://www.rchelpforhomeless.org  HELP, Incorporated [facility] [address], [place], [place] [address] [phone] 24 Hour Crisis Line [phone] Hours of Operation: Monday-Friday, 8:30am-5:00pm http://helpincorporated.org **Includes emergency housing for victims of domestic violence  [facility] of Social [facility] [address] [place], [place] [address] [phone]   or  www.https://hall.info/ **SNAP/EBT/ Other nutritional benefits  [facility] [address] [place], [place], [place] [address] [phone]  or  https://king.net/ **WIC for  women who are pregnant and postpartum, infants and children up to 76 years old  Blessed Table Food Pantry [address] [place], [place] [address] [phone]   or   www.theblessedtable.org  **Food pantry  Brother Kolbe's [address] [place], [place], [place] [address] [phone]   or   https://brotherkolbes.godaddysites.com  **Emergency food and prepared meals  [address] [place] of [place] Food Pantry [address], [place], [place] [address] [phone]   or   www.cedargrovetop.us **Food pantry  [facility] Food Pantry [address] [place], [place] [address] [phone]   or   www.GolfingFamily.no **Food pantry  [facility] Hands Food Pantry [address], [place], [place] [address] [phone] **Food pantry  [facility] [address], [place], [place] [phone]   or   www.greensborourbanministry.org  Tour manager and prepared meals  [facility] [place] [address] Governors Club, [address], [place], [place] [address] VerifiedMovies.gl  **Food pantry  [place] [employer] Food Pantry [address], [place], [place] [address] [phone]   or   www.lbcnow.org  **Food pantry  One Step Further [address] [place], [place] [address] [phone]   or   WorkingMBA.co.nz **Food pantry, nutrition education, gardening activities  [facility] Food Pantry [address], [place], [place] [address] [phone] **Food pantry  [facility] [address], [place], [place] [address] [phone]   or   www.salvationarmyofgreensboro.Roddie Mc of [place] [address], [place], [place] [address] [phone]   or   http://senior-resources-[place].org Dole Food on Wheels Program  St. [facility] [address] [place], [place] [address] [phone]   or   www.stmattchurch.com  **Food pantry  [facility] Food Pantry [address] [place], [place] [address] [phone]   or   vandaliapresbyterianchurch.org **Food pantry  Kelly Services Food Resources  United Stationers Pantry [address], [place], [place] [address] [phone]   or   www.FightingMatch.com.ee Food Engineer, production of Churches [address] Leonard Schwartz [place], [place] [address] [phone] **Food pantry  [facility] Food Resources   Department of [facility] [address], [place], [place] [address] [phone]   or   www.co.[place].Snelling.us/ph/  [facility]-WIC [facility]) [address] [place], [place] [address] [phone]   or   https://www.rios-wells.com/ **WIC for pregnant  and postpartum women, infants and children up to 48 years old  Compassionate Pantry [address], [place], [place] [address] [phone] **Food pantry  [facility] Food Pantry [address] [place], [place] [address] [phone]   or   emerywoodbaptistchurch.com *Food pantry  Five loaves Two Fish Food Pantry [address] [place], [place] [address] [phone]   or   www.fcchighpoint.Gerre ScullFood pantry  Helping [facility] Emergency [facility] [address], [place], [place] [address] [phone]   or   http://www.bird.biz/ **Food pantry  Inman Building [facility] Food Pantry [address] [place], [place] [address] [phone]   or   www.facebook.com/KBCI1 Electronics engineer of [place] Food Pantry [address], [place], [place] [address] [phone]   or   www.abbottscreek.org E. I. du Pont of [place] [phone]   **Delivers meals  New Beginnings Full [facility] [address] [place], [place] [address] [phone]   or   nbfgm.sundaystreamwebsites.com  Tree surgeon of Colgate-Palmolive [address], [place], [place] [address] [phone]   or   www.odm-hp.org  **Food pantry  [address] [place] Food Pantry [address] [place], [place] [address] [phone]   or   R2live.tv **Food pantry  [facility] [address], [place], [place] [address] [phone]   or   WrestlingMonthly.pl **Emergency food and pet food  Senior Adults Association-[place] [address] [place], [place] [address] [phone]   or   www.senioradults.org **Congregate and delivered meals to older adults  [place] Way of Greater Colgate-Palmolive [address], [place], [place] [address] [phone]   or   https://www.miller-montoya.com/ **Back Pack Program for elementary school students  [facility] [address] [place], [place] [address] [phone]   or   www.wardstreetcommunityresources.org **Food pantry  [facility] [address] [place], [place] [address] [phone]   or   ResumeQuery.com.ee **Emergency food, nutrition classes, food budgeting  Food Resources [place]  Department of Social [place]  [address], [place], [place] [phone]  or   www.co.[place].us/pview.aspx?id=[email or web address]&catid=407 **SNAP/Other nutrition benefits  [facility] of Health and [facility] [address], [place], [place] [address] [phone]   or  FutureSponsors.be  **SNAP/Other nutrition benefits  [facility] Department of Public Merit Health Rankin & Nutrition Services [address] [place], [place], [place] [address] [phone]  or  http://www.rockinghamcountypublichealth.org **WIC for pregnant and postpartum women, infants and children up to 22 years old  Aging, Disability and Transit [facility] [address], [place], [place] [address] [phone]  or www.BlackjackCoupons.com.br **Prepared meals for older adults  [facility] Food Pantry [address], [place], [place] [address] [phone]  or  http://caldwell-sandoval.com/ **Food pantry  Hands of God [address] [place], [place] [address] [phone]   or   https://www.handsofgod.org/  Tour manager  Men in [place] Food Pantry [address] [place], [place] [address] [phone] **Food pantry  [place]  Center for Active Retirement Enterprises [address] [place], [place], [place] [address] [phone]   or   www.ci.[place].us/government/parks_and_recreation/senior_center/index.php **Congregate meal for older adults  [facility] [address] [place], [place] [address] [phone]   or www.reidsvilleoutreachcenter.org  **Food pantry  [facility] [address] [place], [place]  [address] [phone]   or   TelevisionEnthusiast.fr Statistician Resources [place] Target Corporation (GTA) [address] [place], [place], [place] [address] https://www.[place].gov/departments/transportation/gdot-divisions/[place]-transit-agency-public-transportation-division     . Fixed-route bus services, including regional fare cards for PART, [place], [place], and WSTA buses.  . Reduced fare bus ID's available for Medicaid, Medicare, and "orange card" recipients.  Marland Kitchen SCAT offers curb-to-curb and door-to-door bus services for people with disabilities who are unable to use a fixed-bus route; also offers a shared-ride program.   Helpful tips:  -Routes available online and physical maps available at the main bus hub lobby (each for a specific route) -Smartphone directions often include bus routes (see the "bus" icon, next to the "car" and "walk" icons) -Routes differ on weekends, evenings and holidays, so plan ahead!  -If you have Medicaid, Medicare, or orange card, plan to obtain a reduced-fare ID to save 50% on rides. Check days and times to obtain an ID, and bring all necessary documents.   Merck & Co System [place]) [address] [place], [place]. [address], [place], [place] [address] [phone] SpotApps.nl **Fixed-bus route services, and demand response bus service for older adults  Department of Social [facility] [address] [place], [place] [address] [phone] www.MysterySinger.com.cy **Medicaid transportation is available to [facility] recipients who need assistance getting to [facility] medical appointments and providers  [facility] [address] [place] [address], [place], [place] [address] www.cjmedicaltransportation.com  ** Offers non-emergency transportation for  medical appointments  Wheels [address] [place], [place] [address] [phone] www.wheels4hope.org **REFERRAL NEEDED by specific agencies (see website), after meeting specified criteria only  Federated Department Stores for Humana Inc) [address] [place], [place] [address] [phone]  BuyingShow.es  *Regional fixed-bus routes between counties (example: [place] to [place]) and Assurant of [place]  [address], [place], [place] [address] [phone] Http://senior-resources-[place].org  Museum/gallery exhibitions officer available (call or see website for details)  Senior Adults Association-[place] [facility] [address] [place], [place] [address] [phone] www.senioradults.org  **Ride coordination  If you are in need of transportation to get to and from your appointments in our office.  You can reach Transportation Services by calling [phone] Monday - Friday  7am-6pm.   /Emotional Wellbeing Apps and Websites Here are a few free apps meant to help you to help yourself.  To find, try searching on the internet to see if the app is offered on Apple/Android devices. If your first choice doesn't come up on your device, the good news is that there are many choices! Play around with different apps to see which ones are helpful to you.    Calm This is an app meant to help increase calm feelings. Includes info, strategies, and tools for tracking your feelings.      Calm Harm  This app is meant to help with self-harm. Provides many 5-minute or 15-min coping strategies for doing instead of hurting yourself.       Healthy Minds Health Minds is a problem-solving tool to help deal with emotions and cope with stress you encounter wherever you are.  MindShift This app can help people cope with anxiety. Rather than trying to avoid anxiety, you can make an important shift and face it.      MY3  MY3 features a  support system, safety plan and resources with the goal of offering a tool to use in a time of need.       My Life My Voice  This mood journal offers a simple solution for tracking your thoughts, feelings and moods. Animated emoticons can help identify your mood.       Relax Melodies Designed to help with sleep, on this app you can mix sounds and meditations for relaxation.      Smiling Mind Smiling Mind is meditation made easy: it's a simple tool that helps put a smile on your mind.        Stop, Breathe & Think  A friendly, simple guide for people through meditations for mindfulness and compassion.  Stop, Breathe and Think Kids Enter your current feelings and choose a "mission" to help you cope. Offers videos for certain moods instead of just sound recordings.       Team Orange The goal of this tool is to help teens change how they think, act, and react. This app helps you focus on your own good feelings and experiences.      The United Stationers Box The United Stationers Box (VHB) contains simple tools to help patients with coping, relaxation, distraction, and positive thinking.

## 2020-10-22 NOTE — Progress Notes (Signed)
INITIAL PRENATAL VISIT NOTE  Subjective:  Jill Mullen is a 34 y.o. [id number] at [redacted]w[redacted]d by approximate LMP being seen today for her initial prenatal visit. This is a unplanned pregnancy.  She was using nothing for birth control previously. She has an obstetric history significant for gestational hypertension/preeclampsia. She has a medical history significant for obesity and chronic hypertension currently treated with antihypertensives .  Patient reports mild to moderate nausea.  Contractions: Not present. Vag. Bleeding: None.  Movement: Present. Denies leaking of fluid.    Past Medical History:  Diagnosis Date  . Hypertension   . Medical history non-contributory   . Pregnancy induced hypertension   . Preterm labor   . Vaginal Pap smear, abnormal 2005    Past Surgical History:  Procedure Laterality Date  . TONSILLECTOMY      OB History  Gravida Para Term Preterm AB Living  $Remov'5 3 2 1 1 3  'LFcPfD$ SAB TAB Ectopic Multiple Live Births  1     0 3    # Outcome Date GA Lbr Len/2nd Weight Sex Delivery Anes PTL Lv  5 Current           4 Term 01/25/19 [redacted]w[redacted]d 02:05 / 00:22 6 lb 11.6 oz (3.05 kg) M Vag-Spont None  LIV     Birth Comments: induced for oligohydraminous , hx preeclampsia  3 SAB 08/2017 [redacted]w[redacted]d         2 Preterm 11/03/11 [redacted]w[redacted]d  6 lb 4 oz (2.835 kg) M Vag-Spont Other  LIV  1 Term 03/02/07 [redacted]w[redacted]d  7 lb 4 oz (3.289 kg) F Vag-Spont Other  LIV     Birth Comments: induced for preeclampsia    Social History   Socioeconomic History  . Marital status: Married    Spouse name: Dru  . Number of children: Not on file  . Years of education: Not on file  . Highest education level: Not on file  Occupational History  . Not on file  Tobacco Use  . Smoking status: Never Smoker  . Smokeless tobacco: Never Used  Vaping Use  . Vaping Use: Never used  Substance and Sexual Activity  . Alcohol use: Not Currently    Comment: rarely  . Drug use: Yes    Types: Marijuana    Comment: seldom  .  Sexual activity: Yes    Birth control/protection: None  Other Topics Concern  . Not on file  Social History Narrative  . Not on file   Social Determinants of Health   Financial Resource Strain:   . Difficulty of Paying Living Expenses: Not on file  Food Insecurity: No Food Insecurity  . Worried About Charity fundraiser in the Last Year: Never true  . Ran Out of Food in the Last Year: Never true  Transportation Needs: No Transportation Needs  . Lack of Transportation (Medical): No  . Lack of Transportation (Non-Medical): No  Physical Activity:   . Days of Exercise per Week: Not on file  . Minutes of Exercise per Session: Not on file  Stress:   . Feeling of Stress : Not on file  Social Connections:   . Frequency of Communication with Friends and Family: Not on file  . Frequency of Social Gatherings with Friends and Family: Not on file  . Attends Religious Services: Not on file  . Active Member of Clubs or Organizations: Not on file  . Attends Archivist Meetings: Not on file  . Marital Status: Not on file  Family History  Problem Relation Age of Onset  . Depression Mother   . Obesity Mother   . Drug abuse Father   . Obesity Father   . ADD / ADHD Sister   . ADD / ADHD Brother   . ADD / ADHD Daughter   . Diabetes Daughter   . Intellectual disability Daughter   . Learning disabilities Daughter   . ADD / ADHD Son   . Arthritis Maternal Grandmother   . Cancer Maternal Grandmother   . Cancer Maternal Grandfather      Current Outpatient Medications:  .  Blood Pressure Monitoring (BLOOD PRESSURE KIT) DEVI, 1 Device by Does not apply route as needed., Disp: 1 each, Rfl: 0 .  Doxylamine-Pyridoxine (DICLEGIS) 10-10 MG TBEC, Take 2 tablets by mouth at bedtime. If not effective may take additional tablet each morning. If not effective may take additional tablet each lunchtime for a maximum dose of 4 tablets daily., Disp: 100 tablet, Rfl: 2 .  NIFEdipine (ADALAT CC)  30 MG 24 hr tablet, Take 30 mg by mouth daily., Disp: , Rfl:  .  omeprazole (PRILOSEC) 40 MG capsule, Take 40 mg by mouth daily., Disp: , Rfl:  .  sertraline (ZOLOFT) 25 MG tablet, Take 25 mg by mouth daily., Disp: , Rfl:  .  aspirin 81 MG chewable tablet, Chew 1 tablet (81 mg total) by mouth daily., Disp: 30 tablet, Rfl: 7 .  doxylamine, Sleep, (UNISOM) 25 MG tablet, Take 25 mg by mouth at bedtime., Disp: , Rfl:  .  fluconazole (DIFLUCAN) 150 MG tablet, Take 1 tablet (150 mg total) by mouth every three (3) days as needed. (Patient not taking: Reported on 10/22/2020), Disp: 2 tablet, Rfl: 0 .  ibuprofen (ADVIL,MOTRIN) 600 MG tablet, Take 1 tablet (600 mg total) by mouth every 6 (six) hours as needed. (Patient not taking: Reported on 10/22/2020), Disp: 30 tablet, Rfl: 0 .  norethindrone (MICRONOR) 0.35 MG tablet, Take 1 tablet (0.35 mg total) by mouth daily. (Patient not taking: Reported on 10/22/2020), Disp: 1 Package, Rfl: 11 .  oxyCODONE (OXY IR/ROXICODONE) 5 MG immediate release tablet, Take 1 tablet (5 mg total) by mouth every 4 (four) hours as needed (pain scale 4-7). (Patient not taking: Reported on 03/28/2019), Disp: 10 tablet, Rfl: 0 .  Prenatal Multivit-Min-Fe-FA (PRENATAL VITAMINS PO), Take by mouth., Disp: , Rfl:  .  Prenatal Vit-Fe Fumarate-FA (MULTIVITAMIN-PRENATAL) 27-0.8 MG TABS tablet, Take 1 tablet by mouth daily at 12 noon. (Patient not taking: Reported on 09/12/2020), Disp: 30 tablet, Rfl: 11 .  promethazine (PHENERGAN) 25 MG tablet, Take 1 tablet (25 mg total) by mouth every 6 (six) hours as needed for nausea or vomiting., Disp: 30 tablet, Rfl: 1 .  pyridOXINE (VITAMIN B-6) 100 MG tablet, Take 100 mg by mouth daily., Disp: , Rfl:   Allergies  Allergen Reactions  . Penicillins Anaphylaxis and Hives    Review of Systems: Negative except for what is mentioned in HPI.  Objective:   Vitals:   10/22/20 1421  BP: 125/78  Pulse: 78  Weight: (!) 316 lb (143.3 kg)    Fetal  Status: Fetal Heart Rate (bpm): 156   Movement: Present     Physical Exam: BP 125/78   Pulse 78   Wt (!) 316 lb (143.3 kg)   LMP 06/29/2020 (Within Days)   BMI 46.67 kg/m  CONSTITUTIONAL: Well-developed, well-nourished female in no acute distress.  NEUROLOGIC: Alert and oriented to person, place, and time. Normal reflexes, muscle tone  coordination. No cranial nerve deficit noted. PSYCHIATRIC: Normal mood and affect. Normal behavior. Normal judgment and thought content. SKIN: Skin is warm and dry. No rash noted. Not diaphoretic. No erythema. No pallor. HENT:  Normocephalic, atraumatic, External right and left ear normal. Oropharynx is clear and moist EYES: Conjunctivae and EOM are normal. Pupils are equal, round, and reactive to light. No scleral icterus.  NECK: Normal range of motion, supple, no masses CARDIOVASCULAR: Normal heart rate noted, regular rhythm RESPIRATORY: Effort and breath sounds normal, no problems with respiration noted BREASTS: deferred ABDOMEN: Soft, nontender, nondistended, gravid, obese. GU: normal appearing external female genitalia, multiparous normal appearing cervix, scant white discharge in vagina, no lesions noted, cervix closed Bimanual: 15 weeks sized uterus, no adnexal tenderness or palpable lesions noted, exam suboptimal due to body habitus MUSCULOSKELETAL: Normal range of motion. EXT:  No edema and no tenderness. 2+ distal pulses.   Assessment and Plan:  Pregnancy: [id number] at [redacted]w[redacted]d by LMP  1. Supervision of high risk pregnancy, antepartum  - GC/Chlamydia probe amp ([place])not at [facility] - Culture, OB Urine - CBC/D/Plt+RPR+Rh+ABO+Rub Ab... - Comprehensive metabolic panel - Protein / creatinine ratio, urine  2. Morbid obesity ([place])   3. History of gestational hypertension Pt already taking procardia with good control, testing at 32 weeks per guidelines, growth scans per MFM - Comprehensive metabolic panel - Protein / creatinine ratio,  urine - aspirin 81 MG chewable tablet; Chew 1 tablet (81 mg total) by mouth daily.  Dispense: 30 tablet; Refill: 7  4. History of preterm delivery, currently pregnant  - GC/Chlamydia probe amp ([place])not at [facility] - Culture, OB Urine - CBC/D/Plt+RPR+Rh+ABO+Rub Ab...  5. [redacted] weeks gestation of pregnancy   6. BMI 45.0-49.9, adult ([place])   7. Hx of preeclampsia, prior pregnancy, currently pregnant Baseline PIH labs, start po baby ASA  8. Nausea and vomiting during pregnancy Pt is tolerating fluids and diet, she was concerned about some weightloss - promethazine (PHENERGAN) 25 MG tablet; Take 1 tablet (25 mg total) by mouth every 6 (six) hours as needed for nausea or vomiting.  Dispense: 30 tablet; Refill: 1   Preterm labor symptoms and general obstetric precautions including but not limited to vaginal bleeding, contractions, leaking of fluid and fetal movement were reviewed in detail with the patient.  Please refer to After Visit Summary for other counseling recommendations.   Return in about 4 weeks (around 11/19/2020) for [facility], in person, AFP.  Griffin Basil 10/22/2020 3:55 PM

## 2020-10-22 NOTE — Progress Notes (Signed)
2019 last papsmaer

## 2020-10-23 ENCOUNTER — Encounter: Payer: Self-pay | Admitting: *Deleted

## 2020-10-23 LAB — COMPREHENSIVE METABOLIC PANEL
ALT: 19 IU/L (ref 0–32)
AST: 12 IU/L (ref 0–40)
Albumin/Globulin Ratio: 1.1 — ABNORMAL LOW (ref 1.2–2.2)
Albumin: 4 g/dL (ref 3.8–4.8)
Alkaline Phosphatase: 41 IU/L — ABNORMAL LOW (ref 44–121)
BUN/Creatinine Ratio: 12 (ref 9–23)
BUN: 7 mg/dL (ref 6–20)
Bilirubin Total: <0.2 mg/dL (ref 0.0–1.2)
CO2: 19 mmol/L — ABNORMAL LOW (ref 20–29)
Calcium: 9.4 mg/dL (ref 8.7–10.2)
Chloride: 103 mmol/L (ref 96–106)
Creatinine, Ser: 0.59 mg/dL (ref 0.57–1.00)
GFR calc Af Amer: 139 mL/min/{1.73_m2} (ref >59)
GFR calc non Af Amer: 121 mL/min/{1.73_m2} (ref >59)
Globulin, Total: 3.5 g/dL (ref 1.5–4.5)
Glucose: 111 mg/dL — ABNORMAL HIGH (ref 65–99)
Potassium: 3.8 mmol/L (ref 3.5–5.2)
Sodium: 138 mmol/L (ref 134–144)
Total Protein: 7.5 g/dL (ref 6.0–8.5)

## 2020-10-23 LAB — CBC/D/PLT+RPR+RH+ABO+RUB AB...
Antibody Screen: NEGATIVE
Basophils Absolute: 0 10*3/uL (ref 0.0–0.2)
Basos: 0 %
EOS (ABSOLUTE): 0.1 10*3/uL (ref 0.0–0.4)
Eos: 1 %
HCV Ab: 0.3 s/co ratio (ref 0.0–0.9)
HIV Screen 4th Generation wRfx: NONREACTIVE
Hematocrit: 35.4 % (ref 34.0–46.6)
Hemoglobin: 12.3 g/dL (ref 11.1–15.9)
Hepatitis B Surface Ag: NEGATIVE
Immature Grans (Abs): 0 10*3/uL (ref 0.0–0.1)
Immature Granulocytes: 1 %
Lymphocytes Absolute: 2.6 10*3/uL (ref 0.7–3.1)
Lymphs: 30 %
MCH: 30.1 pg (ref 26.6–33.0)
MCHC: 34.7 g/dL (ref 31.5–35.7)
MCV: 87 fL (ref 79–97)
Monocytes Absolute: 0.6 10*3/uL (ref 0.1–0.9)
Monocytes: 7 %
Neutrophils Absolute: 5.3 10*3/uL (ref 1.4–7.0)
Neutrophils: 61 %
Platelets: 266 10*3/uL (ref 150–450)
RBC: 4.09 x10E6/uL (ref 3.77–5.28)
RDW: 14.7 % (ref 11.7–15.4)
RPR Ser Ql: NONREACTIVE
Rh Factor: POSITIVE
Rubella Antibodies, IGG: 1.3 index (ref >0.99)
WBC: 8.6 10*3/uL (ref 3.4–10.8)

## 2020-10-23 LAB — PROTEIN / CREATININE RATIO, URINE
Creatinine, Urine: 153.3 mg/dL
Protein, Ur: 16.1 mg/dL
Protein/Creat Ratio: 105 mg/g creat (ref 0–200)

## 2020-10-23 LAB — GC/CHLAMYDIA PROBE AMP (~~LOC~~) NOT AT ARMC
Chlamydia: NEGATIVE
Comment: NEGATIVE
Comment: NORMAL
Neisseria Gonorrhea: NEGATIVE

## 2020-10-23 LAB — HCV INTERPRETATION

## 2020-10-24 LAB — URINE CULTURE, OB REFLEX

## 2020-10-24 LAB — CULTURE, OB URINE

## 2020-10-27 NOTE — BH Specialist Note (Signed)
Integrated Behavioral Health via Telemedicine Visit  10/27/2020 Jill Mullen [id number]  Number of Integrated Behavioral Health visits: 2 Session Start time: 10:20 Session End time: 10:47 Total time: 27  Referring Provider: Mariel Aloe, MD Patient/Family location: [facility] Provider location: [facility]  All persons participating in visit: Patient Jill Mullen and [facility] Jill Mullen   Types of Service: Individual psychotherapy  I connected with Jill Mullen  by Video enabled telemedicine application [employer] and verified that I am speaking with the correct person using two identifiers.    Discussed confidentiality: Yes   I discussed the limitations of telemedicine and the availability of in person appointments.  Discussed there is a possibility of technology failure and discussed alternative modes of communication if that failure occurs.  I discussed that engaging in this telemedicine visit, they consent to the provision of behavioral healthcare and the services will be billed under their insurance.  Patient and/or legal guardian expressed understanding and consented to Telemedicine visit: Yes   Presenting Concerns: Patient and/or family reports the following symptoms/concerns: Pt states her primary concern today is feeling a panic attack the past week, along with increase in worry; used relaxation breathing exercise to help manage panic, and is open to learning additional self-coping strategy to manage worry.  Duration of problem: Current pregnancy; Severity of problem: moderate  Patient and/or Family's Strengths/Protective Factors: Social connections and [place] supports in place (healthy food, safe environments, etc.)  Goals Addressed: Patient will: 1.  Reduce symptoms of: anxiety, depression, mood instability and stress  2.  Increase knowledge and/or ability of: self-management skills  3.  Demonstrate  ability to: Increase healthy adjustment to current life circumstances  Progress towards Goals: Ongoing  Interventions: Interventions utilized:  CBT Cognitive Behavioral Therapy and Psychoeducation and/or Health Education Standardized Assessments completed: GAD-7 and PHQ 9  Patient and/or Family Response: Pt agrees to revised treatment plan; relaxation breathing exercise helped manage in midst of pani  Assessment: Patient currently experiencing Adjustment disorder with mixed anxiety depression and Psychosocial stress.   Patient may benefit from psychoeducation and brief therapeutic interventions regarding coping with symptoms of anxiety, depression, life stress .  Plan: 1. Follow up with behavioral health clinician on : Two weeks 2. Behavioral recommendations:  -Begin Worry Time strategy starting tomorrow; practice daily for two weeks -Continue using CALM relaxation breathing exercise at least once daily -Continue taking prenatal vitamin as recommended by medical provider -Continue using community resources as needed (from last visit's AVS) 3. Referral(s): Integrated [employer] (In Clinic)  I discussed the assessment and treatment plan with the patient and/or parent/guardian. They were provided an opportunity to ask questions and all were answered. They agreed with the plan and demonstrated an understanding of the instructions.   They were advised to call back or seek an in-person evaluation if the symptoms worsen or if the condition fails to improve as anticipated.  Jill Lips, LCSW   Depression screen [facility] 2/9 11/05/2020 10/22/2020 09/12/2020 03/27/2020 01/16/2019  Decreased Interest 1 2 1 1  0  Down, Depressed, Hopeless 1 3 1 3  0  PHQ - 2 Score 2 5 2 4  0  Altered sleeping 3 2 0 3 1  Tired, decreased energy 1 3 0 2 2  Change in appetite 2 2 0 2 0  Feeling bad or failure about yourself  2 3 0 1 0  Trouble concentrating 1 1 0 1 0  Moving slowly or  fidgety/restless 0  0 0 0 1  Suicidal thoughts 0 0 0 0 0  PHQ-9 Score 11 16 2 13 4    GAD 7 : Generalized Anxiety Score 11/05/2020 10/22/2020 09/12/2020 03/27/2020  Nervous, Anxious, on Edge 3 1 1 2   Control/stop worrying 3 1 0 1  Worry too much - different things 3 1 0 1  Trouble relaxing 1 1 1 1   Restless 1 1 0 0  Easily annoyed or irritable 0 1 1 2   Afraid - awful might happen 0 0 0 0  Total GAD 7 Score 11 6 3  7

## 2020-11-04 ENCOUNTER — Other Ambulatory Visit: Payer: Self-pay | Admitting: *Deleted

## 2020-11-04 MED ORDER — OMEPRAZOLE 40 MG PO CPDR: 40.0000 mg | DELAYED_RELEASE_CAPSULE | Freq: Every day | ORAL | 3 refills | Status: AC

## 2020-11-05 ENCOUNTER — Ambulatory Visit (INDEPENDENT_AMBULATORY_CARE_PROVIDER_SITE_OTHER): Payer: Medicaid Other | Admitting: Clinical

## 2020-11-05 DIAGNOSIS — F4323 Adjustment disorder with mixed anxiety and depressed mood: Secondary | ICD-10-CM | POA: Diagnosis not present

## 2020-11-10 ENCOUNTER — Ambulatory Visit: Payer: Medicaid Other | Attending: Obstetrics and Gynecology

## 2020-11-10 ENCOUNTER — Ambulatory Visit: Payer: Medicaid Other | Admitting: *Deleted

## 2020-11-10 ENCOUNTER — Encounter: Payer: Self-pay | Admitting: *Deleted

## 2020-11-10 ENCOUNTER — Other Ambulatory Visit: Payer: Self-pay | Admitting: *Deleted

## 2020-11-10 ENCOUNTER — Other Ambulatory Visit: Payer: Self-pay

## 2020-11-10 DIAGNOSIS — O09899 Supervision of other high risk pregnancies, unspecified trimester: Secondary | ICD-10-CM

## 2020-11-10 DIAGNOSIS — I10 Essential (primary) hypertension: Secondary | ICD-10-CM | POA: Diagnosis not present

## 2020-11-10 DIAGNOSIS — O0990 Supervision of high risk pregnancy, unspecified, unspecified trimester: Secondary | ICD-10-CM | POA: Diagnosis not present

## 2020-11-10 DIAGNOSIS — Z8759 Personal history of other complications of pregnancy, childbirth and the puerperium: Secondary | ICD-10-CM

## 2020-11-10 DIAGNOSIS — Z6841 Body Mass Index (BMI) 40.0 and over, adult: Secondary | ICD-10-CM

## 2020-11-10 NOTE — Progress Notes (Signed)
C/O"lower abd pain."

## 2020-11-10 NOTE — BH Specialist Note (Deleted)
Integrated Behavioral Health via Telemedicine Visit  11/10/2020 Jill Mullen [id number]  Number of Integrated Behavioral Health visits: *** Session Start time: 10:45***  Session End time: 11:15*** Total time: {IBH Total Time:21014050}  Referring Provider: *** Patient/Family location: Home*** [facility]: [facility]  All persons participating in visit: Patient *** and Jill Mullen Jill Mullen ***  Types of Service: {CHL AMB TYPE OF SERVICE:[phone]}  I connected with Jill Mullen and/or Jill Mullen's {family members:20773} by {CHL AMB IBH TELEMEDICINE MODES:[phone]} and verified that I am speaking with the correct person using two identifiers.    Discussed confidentiality: {YES/NO:21197}  I discussed the limitations of telemedicine and the availability of in person appointments.  Discussed there is a possibility of technology failure and discussed alternative modes of communication if that failure occurs.  I discussed that engaging in this telemedicine visit, they consent to the provision of behavioral healthcare and the services will be billed under their insurance.  Patient and/or legal guardian expressed understanding and consented to Telemedicine visit: {YES/NO:21197}  Presenting Concerns: Patient and/or family reports the following symptoms/concerns: *** Duration of problem: ***; Severity of problem: {Mild/Moderate/Severe:20260}  Patient and/or Family's Strengths/Protective Factors: {CHL AMB BH PROTECTIVE FACTORS:[phone]}  Goals Addressed: Patient will: 1.  Reduce symptoms of: {IBH Symptoms:21014056}  2.  Increase knowledge and/or ability of: {IBH Patient Tools:21014057}  3.  Demonstrate ability to: {IBH Goals:21014053}  Progress towards Goals: {CHL AMB BH PROGRESS TOWARDS [phone]}  Interventions: Interventions utilized:  {IBH Interventions:21014054} Standardized Assessments  completed: {IBH Screening Tools:21014051}  Patient and/or Family Response: ***  Assessment: Patient currently experiencing ***.   Patient may benefit from ***.  Plan: 1. Follow up with behavioral health clinician on : *** 2. Behavioral recommendations: *** 3. Referral(s): {IBH Referrals:21014055}  I discussed the assessment and treatment plan with the patient and/or parent/guardian. They were provided an opportunity to ask questions and all were answered. They agreed with the plan and demonstrated an understanding of the instructions.   They were advised to call back or seek an in-person evaluation if the symptoms worsen or if the condition fails to improve as anticipated.  Jill Close Nadya Hopwood, LCSW

## 2020-11-19 ENCOUNTER — Ambulatory Visit (INDEPENDENT_AMBULATORY_CARE_PROVIDER_SITE_OTHER): Payer: Medicaid Other | Admitting: Family Medicine

## 2020-11-19 ENCOUNTER — Other Ambulatory Visit: Payer: Self-pay

## 2020-11-19 ENCOUNTER — Encounter: Payer: Self-pay | Admitting: Family Medicine

## 2020-11-19 VITALS — BP 129/84 | HR 109 | Wt 319.6 lb

## 2020-11-19 DIAGNOSIS — Z8759 Personal history of other complications of pregnancy, childbirth and the puerperium: Secondary | ICD-10-CM

## 2020-11-19 DIAGNOSIS — O09899 Supervision of other high risk pregnancies, unspecified trimester: Secondary | ICD-10-CM

## 2020-11-19 DIAGNOSIS — O09299 Supervision of pregnancy with other poor reproductive or obstetric history, unspecified trimester: Secondary | ICD-10-CM

## 2020-11-19 DIAGNOSIS — O10919 Unspecified pre-existing hypertension complicating pregnancy, unspecified trimester: Secondary | ICD-10-CM

## 2020-11-19 DIAGNOSIS — O0990 Supervision of high risk pregnancy, unspecified, unspecified trimester: Secondary | ICD-10-CM

## 2020-11-19 NOTE — Progress Notes (Signed)
   Subjective:  Jill Mullen is a 34 y.o. [id number] at [redacted]w[redacted]d being seen today for ongoing prenatal care.  She is currently monitored for the following issues for this high-risk pregnancy and has Tobacco use; Morbid obesity (HCC); Supervision of high risk pregnancy, antepartum; Chronic hypertension affecting pregnancy; History of gestational hypertension; History of preterm delivery, currently pregnant; [redacted] weeks gestation of pregnancy; BMI 45.0-49.9, adult (HCC); Hx of preeclampsia, prior pregnancy, currently pregnant; and Nausea and vomiting during pregnancy on their problem list.  Patient reports no complaints.  Contractions: Not present. Vag. Bleeding: None.  Movement: Present. Denies leaking of fluid.   The following portions of the patient's history were reviewed and updated as appropriate: allergies, current medications, past family history, past medical history, past social history, past surgical history and problem list. Problem list updated.  Objective:   Vitals:   11/19/20 1343  BP: 129/84  Pulse: (!) 109  Weight: (!) 319 lb 9.6 oz (145 kg)    Fetal Status: Fetal Heart Rate (bpm): 152   Movement: Present     General:  Alert, oriented and cooperative. Patient is in no acute distress.  Skin: Skin is warm and dry. No rash noted.   Cardiovascular: Normal heart rate noted  Respiratory: Normal respiratory effort, no problems with respiration noted  Abdomen: Soft, gravid, appropriate for gestational age. Pain/Pressure: Present     Pelvic: Vag. Bleeding: None     Cervical exam deferred        Extremities: Normal range of motion.  Edema: Trace  Mental Status: Normal mood and affect. Normal behavior. Normal judgment and thought content.   Urinalysis:      Assessment and Plan:  Pregnancy: [id number] at [redacted]w[redacted]d  1. Supervision of high risk pregnancy, antepartum BP and FHR normal today Discussed contraception, deciding between post placental IUD or interval tubal  2. Morbid obesity  (HCC) Following w MFM  3. Hx of preeclampsia, prior pregnancy, currently pregnant CHTN, see below Baseline labs normal  4. History of preterm delivery, currently pregnant [redacted] wks, not on M[place]a  5. History of gestational hypertension   6. Chronic hypertension affecting pregnancy On procardia 30mg  XL, BP well controlled today Following w MFM, start antenatal testing at 32 weeks  Preterm labor symptoms and general obstetric precautions including but not limited to vaginal bleeding, contractions, leaking of fluid and fetal movement were reviewed in detail with the patient. Please refer to After Visit Summary for other counseling recommendations.  Return in 4 weeks (on 12/17/2020).   02/14/2021, MD

## 2020-11-19 NOTE — Patient Instructions (Signed)
 Second Trimester of Pregnancy The second trimester is from week 14 through week 27 (months 4 through 6). The second trimester is often a time when you feel your best. Your body has adjusted to being pregnant, and you begin to feel better physically. Usually, morning sickness has lessened or quit completely, you may have more energy, and you may have an increase in appetite. The second trimester is also a time when the fetus is growing rapidly. At the end of the sixth month, the fetus is about 9 inches long and weighs about 1 pounds. You will likely begin to feel the baby move (quickening) between 16 and 20 weeks of pregnancy. Body changes during your second trimester Your body continues to go through many changes during your second trimester. The changes vary from woman to woman.  Your weight will continue to increase. You will notice your lower abdomen bulging out.  You may begin to get stretch marks on your hips, abdomen, and breasts.  You may develop headaches that can be relieved by medicines. The medicines should be approved by your health care provider.  You may urinate more often because the fetus is pressing on your bladder.  You may develop or continue to have heartburn as a result of your pregnancy.  You may develop constipation because certain hormones are causing the muscles that push waste through your intestines to slow down.  You may develop hemorrhoids or swollen, bulging veins (varicose veins).  You may have back pain. This is caused by: ? Weight gain. ? Pregnancy hormones that are relaxing the joints in your pelvis. ? A shift in weight and the muscles that support your balance.  Your breasts will continue to grow and they will continue to become tender.  Your gums may bleed and may be sensitive to brushing and flossing.  Dark spots or blotches (chloasma, mask of pregnancy) may develop on your face. This will likely fade after the baby is born.  A dark line from  your belly button to the pubic area (linea nigra) may appear. This will likely fade after the baby is born.  You may have changes in your hair. These can include thickening of your hair, rapid growth, and changes in texture. Some women also have hair loss during or after pregnancy, or hair that feels dry or thin. Your hair will most likely return to normal after your baby is born. What to expect at prenatal visits During a routine prenatal visit:  You will be weighed to make sure you and the fetus are growing normally.  Your blood pressure will be taken.  Your abdomen will be measured to track your baby's growth.  The fetal heartbeat will be listened to.  Any test results from the previous visit will be discussed. Your health care provider may ask you:  How you are feeling.  If you are feeling the baby move.  If you have had any abnormal symptoms, such as leaking fluid, bleeding, severe headaches, or abdominal cramping.  If you are using any tobacco products, including cigarettes, chewing tobacco, and electronic cigarettes.  If you have any questions. Other tests that may be performed during your second trimester include:  Blood tests that check for: ? Low iron levels (anemia). ? High blood sugar that affects pregnant women (gestational diabetes) between 24 and 28 weeks. ? Rh antibodies. This is to check for a protein on red blood cells (Rh factor).  Urine tests to check for infections, diabetes, or protein in   the urine.  An ultrasound to confirm the proper growth and development of the baby.  An amniocentesis to check for possible genetic problems.  Fetal screens for spina bifida and Down syndrome.  HIV (human immunodeficiency virus) testing. Routine prenatal testing includes screening for HIV, unless you choose not to have this test. Follow these instructions at home: Medicines  Follow your health care provider's instructions regarding medicine use. Specific medicines  may be either safe or unsafe to take during pregnancy.  Take a prenatal vitamin that contains at least 600 micrograms (mcg) of folic acid.  If you develop constipation, try taking a stool softener if your health care provider approves. Eating and drinking   Eat a balanced diet that includes fresh fruits and vegetables, whole grains, good sources of protein such as meat, eggs, or tofu, and low-fat dairy. Your health care provider will help you determine the amount of weight gain that is right for you.  Avoid raw meat and uncooked cheese. These carry germs that can cause birth defects in the baby.  If you have low calcium intake from food, talk to your health care provider about whether you should take a daily calcium supplement.  Limit foods that are high in fat and processed sugars, such as fried and sweet foods.  To prevent constipation: ? Drink enough fluid to keep your urine clear or pale yellow. ? Eat foods that are high in fiber, such as fresh fruits and vegetables, whole grains, and beans. Activity  Exercise only as directed by your health care provider. Most women can continue their usual exercise routine during pregnancy. Try to exercise for 30 minutes at least 5 days a week. Stop exercising if you experience uterine contractions.  Avoid heavy lifting, wear low heel shoes, and practice good posture.  A sexual relationship may be continued unless your health care provider directs you otherwise. Relieving pain and discomfort  Wear a good support bra to prevent discomfort from breast tenderness.  Take warm sitz baths to soothe any pain or discomfort caused by hemorrhoids. Use hemorrhoid cream if your health care provider approves.  Rest with your legs elevated if you have leg cramps or low back pain.  If you develop varicose veins, wear support hose. Elevate your feet for 15 minutes, 3-4 times a day. Limit salt in your diet. Prenatal Care  Write down your questions. Take  them to your prenatal visits.  Keep all your prenatal visits as told by your health care provider. This is important. Safety  Wear your seat belt at all times when driving.  Make a list of emergency phone numbers, including numbers for family, friends, the hospital, and police and fire departments. General instructions  Ask your health care provider for a referral to a local prenatal education class. Begin classes no later than the beginning of month 6 of your pregnancy.  Ask for help if you have counseling or nutritional needs during pregnancy. Your health care provider can offer advice or refer you to specialists for help with various needs.  Do not use hot tubs, steam rooms, or saunas.  Do not douche or use tampons or scented sanitary pads.  Do not cross your legs for long periods of time.  Avoid cat litter boxes and soil used by cats. These carry germs that can cause birth defects in the baby and possibly loss of the fetus by miscarriage or stillbirth.  Avoid all smoking, herbs, alcohol, and unprescribed drugs. Chemicals in these products can affect the   formation and growth of the baby.  Do not use any products that contain nicotine or tobacco, such as cigarettes and e-cigarettes. If you need help quitting, ask your health care provider.  Visit your dentist if you have not gone yet during your pregnancy. Use a soft toothbrush to brush your teeth and be gentle when you floss. Contact a health care provider if:  You have dizziness.  You have mild pelvic cramps, pelvic pressure, or nagging pain in the abdominal area.  You have persistent nausea, vomiting, or diarrhea.  You have a bad smelling vaginal discharge.  You have pain when you urinate. Get help right away if:  You have a fever.  You are leaking fluid from your vagina.  You have spotting or bleeding from your vagina.  You have severe abdominal cramping or pain.  You have rapid weight gain or weight loss.  You  have shortness of breath with chest pain.  You notice sudden or extreme swelling of your face, hands, ankles, feet, or legs.  You have not felt your baby move in over an hour.  You have severe headaches that do not go away when you take medicine.  You have vision changes. Summary  The second trimester is from week 14 through week 27 (months 4 through 6). It is also a time when the fetus is growing rapidly.  Your body goes through many changes during pregnancy. The changes vary from woman to woman.  Avoid all smoking, herbs, alcohol, and unprescribed drugs. These chemicals affect the formation and growth your baby.  Do not use any tobacco products, such as cigarettes, chewing tobacco, and e-cigarettes. If you need help quitting, ask your health care provider.  Contact your health care provider if you have any questions. Keep all prenatal visits as told by your health care provider. This is important. This information is not intended to replace advice given to you by your health care provider. Make sure you discuss any questions you have with your health care provider. Document Revised: 03/16/2019 Document Reviewed: 12/28/2016 Else[employer] Patient Education  2020 [employer]   Contraception Choices Contraception, also called birth control, refers to methods or devices that prevent pregnancy. Hormonal methods Contraceptive implant  A contraceptive implant is a thin, plastic tube that contains a hormone. It is inserted into the upper part of the arm. It can remain in place for up to 3 years. Progestin-only injections Progestin-only injections are injections of progestin, a synthetic form of the hormone progesterone. They are given every 3 months by a health care provider. Birth control pills  Birth control pills are pills that contain hormones that prevent pregnancy. They must be taken once a day, preferably at the same time each day. Birth control patch  The birth control patch  contains hormones that prevent pregnancy. It is placed on the skin and must be changed once a week for three weeks and removed on the fourth week. A prescription is needed to use this method of contraception. Vaginal ring  A vaginal ring contains hormones that prevent pregnancy. It is placed in the vagina for three weeks and removed on the fourth week. After that, the process is repeated with a new ring. A prescription is needed to use this method of contraception. Emergency contraceptive Emergency contraceptives prevent pregnancy after unprotected sex. They come in pill form and can be taken up to 5 days after sex. They work best the sooner they are taken after having sex. Most emergency contraceptives are available   without a prescription. This method should not be used as your only form of birth control. Barrier methods Female condom  A female condom is a thin sheath that is worn over the penis during sex. Condoms keep sperm from going inside a woman's body. They can be used with a spermicide to increase their effectiveness. They should be disposed after a single use. Female condom  A female condom is a soft, loose-fitting sheath that is put into the vagina before sex. The condom keeps sperm from going inside a woman's body. They should be disposed after a single use. Diaphragm  A diaphragm is a soft, dome-shaped barrier. It is inserted into the vagina before sex, along with a spermicide. The diaphragm blocks sperm from entering the uterus, and the spermicide kills sperm. A diaphragm should be left in the vagina for 6-8 hours after sex and removed within 24 hours. A diaphragm is prescribed and fitted by a health care provider. A diaphragm should be replaced every 1-2 years, after giving birth, after gaining more than 15 lb (6.8 kg), and after pelvic surgery. Cervical cap  A cervical cap is a round, soft latex or plastic cup that fits over the cervix. It is inserted into the vagina before sex, along  with spermicide. It blocks sperm from entering the uterus. The cap should be left in place for 6-8 hours after sex and removed within 48 hours. A cervical cap must be prescribed and fitted by a health care provider. It should be replaced every 2 years. Sponge  A sponge is a soft, circular piece of polyurethane foam with spermicide on it. The sponge helps block sperm from entering the uterus, and the spermicide kills sperm. To use it, you make it wet and then insert it into the vagina. It should be inserted before sex, left in for at least 6 hours after sex, and removed and thrown away within 30 hours. Spermicides Spermicides are chemicals that kill or block sperm from entering the cervix and uterus. They can come as a cream, jelly, suppository, foam, or tablet. A spermicide should be inserted into the vagina with an applicator at least 10-15 minutes before sex to allow time for it to work. The process must be repeated every time you have sex. Spermicides do not require a prescription. Intrauterine contraception Intrauterine device (IUD) An IUD is a T-shaped device that is put in a woman's uterus. There are two types:  Hormone IUD.This type contains progestin, a synthetic form of the hormone progesterone. This type can stay in place for 3-5 years.  Copper IUD.This type is wrapped in copper wire. It can stay in place for 10 years.  Permanent methods of contraception Female tubal ligation In this method, a woman's fallopian tubes are sealed, tied, or blocked during surgery to prevent eggs from traveling to the uterus. Hysteroscopic sterilization In this method, a small, flexible insert is placed into each fallopian tube. The inserts cause scar tissue to form in the fallopian tubes and block them, so sperm cannot reach an egg. The procedure takes about 3 months to be effective. Another form of birth control must be used during those 3 months. Female sterilization This is a procedure to tie off the  tubes that carry sperm (vasectomy). After the procedure, the man can still ejaculate fluid (semen). Natural planning methods Natural family planning In this method, a couple does not have sex on days when the woman could become pregnant. Calendar method This means keeping track of the length   of each menstrual cycle, identifying the days when pregnancy can happen, and not having sex on those days. Ovulation method In this method, a couple avoids sex during ovulation. Symptothermal method This method involves not having sex during ovulation. The woman typically checks for ovulation by watching changes in her temperature and in the consistency of cervical mucus. Post-ovulation method In this method, a couple waits to have sex until after ovulation. Summary  Contraception, also called birth control, means methods or devices that prevent pregnancy.  Hormonal methods of contraception include implants, injections, pills, patches, vaginal rings, and emergency contraceptives.  Barrier methods of contraception can include female condoms, female condoms, diaphragms, cervical caps, sponges, and spermicides.  There are two types of IUDs (intrauterine devices). An IUD can be put in a woman's uterus to prevent pregnancy for 3-5 years.  Permanent sterilization can be done through a procedure for males, females, or both.  Natural family planning methods involve not having sex on days when the woman could become pregnant. This information is not intended to replace advice given to you by your health care provider. Make sure you discuss any questions you have with your health care provider. Document Revised: 11/24/2017 Document Reviewed: 12/25/2016 [employer] Patient Education  2020 [employer]   Breastfeeding  Choosing to breastfeed is one of the best decisions you can make for yourself and your baby. A change in hormones during pregnancy causes your breasts to make breast milk in your milk-producing  glands. Hormones prevent breast milk from being released before your baby is born. They also prompt milk flow after birth. Once breastfeeding has begun, thoughts of your baby, as well as his or her sucking or crying, can stimulate the release of milk from your milk-producing glands. Benefits of breastfeeding Research shows that breastfeeding offers many health benefits for infants and mothers. It also offers a cost-free and convenient way to feed your baby. For your baby  Your first milk (colostrum) helps your baby's digestive system to function better.  Special cells in your milk (antibodies) help your baby to fight off infections.  Breastfed babies are less likely to develop asthma, allergies, obesity, or type 2 diabetes. They are also at lower risk for sudden infant death syndrome (SIDS).  Nutrients in breast milk are better able to meet your baby's needs compared to infant formula.  Breast milk improves your baby's brain development. For you  Breastfeeding helps to create a very special bond between you and your baby.  Breastfeeding is convenient. Breast milk costs nothing and is always available at the correct temperature.  Breastfeeding helps to burn calories. It helps you to lose the weight that you gained during pregnancy.  Breastfeeding makes your uterus return faster to its size before pregnancy. It also slows bleeding (lochia) after you give birth.  Breastfeeding helps to lower your risk of developing type 2 diabetes, osteoporosis, rheumatoid arthritis, cardiovascular disease, and breast, ovarian, uterine, and endometrial cancer later in life. Breastfeeding basics Starting breastfeeding  Find a comfortable place to sit or lie down, with your neck and back well-supported.  Place a pillow or a rolled-up blanket under your baby to bring him or her to the level of your breast (if you are seated). Nursing pillows are specially designed to help support your arms and your baby while  you breastfeed.  Make sure that your baby's tummy (abdomen) is facing your abdomen.  Gently massage your breast. With your fingertips, massage from the outer edges of your breast inward toward   the nipple. This encourages milk flow. If your milk flows slowly, you may need to continue this action during the feeding.  Support your breast with 4 fingers underneath and your thumb above your nipple (make the letter "C" with your hand). Make sure your fingers are well away from your nipple and your baby's mouth.  Stroke your baby's lips gently with your finger or nipple.  When your baby's mouth is open wide enough, quickly bring your baby to your breast, placing your entire nipple and as much of the areola as possible into your baby's mouth. The areola is the colored area around your nipple. ? More areola should be visible above your baby's upper lip than below the lower lip. ? Your baby's lips should be opened and extended outward (flanged) to ensure an adequate, comfortable latch. ? Your baby's tongue should be between his or her lower gum and your breast.  Make sure that your baby's mouth is correctly positioned around your nipple (latched). Your baby's lips should create a seal on your breast and be turned out (everted).  It is common for your baby to suck about 2-3 minutes in order to start the flow of breast milk. Latching Teaching your baby how to latch onto your breast properly is very important. An improper latch can cause nipple pain, decreased milk supply, and poor weight gain in your baby. Also, if your baby is not latched onto your nipple properly, he or she may swallow some air during feeding. This can make your baby fussy. Burping your baby when you switch breasts during the feeding can help to get rid of the air. However, teaching your baby to latch on properly is still the best way to prevent fussiness from swallowing air while breastfeeding. Signs that your baby has successfully  latched onto your nipple  Silent tugging or silent sucking, without causing you pain. Infant's lips should be extended outward (flanged).  Swallowing heard between every 3-4 sucks once your milk has started to flow (after your let-down milk reflex occurs).  Muscle movement above and in front of his or her ears while sucking. Signs that your baby has not successfully latched onto your nipple  Sucking sounds or smacking sounds from your baby while breastfeeding.  Nipple pain. If you think your baby has not latched on correctly, slip your finger into the corner of your baby's mouth to break the suction and place it between your baby's gums. Attempt to start breastfeeding again. Signs of successful breastfeeding Signs from your baby  Your baby will gradually decrease the number of sucks or will completely stop sucking.  Your baby will fall asleep.  Your baby's body will relax.  Your baby will retain a small amount of milk in his or her mouth.  Your baby will let go of your breast by himself or herself. Signs from you  Breasts that have increased in firmness, weight, and size 1-3 hours after feeding.  Breasts that are softer immediately after breastfeeding.  Increased milk volume, as well as a change in milk consistency and color by the fifth day of breastfeeding.  Nipples that are not sore, cracked, or bleeding. Signs that your baby is getting enough milk  Wetting at least 1-2 diapers during the first 24 hours after birth.  Wetting at least 5-6 diapers every 24 hours for the first week after birth. The urine should be clear or pale yellow by the age of 5 days.  Wetting 6-8 diapers every 24 hours as   your baby continues to grow and develop.  At least 3 stools in a 24-hour period by the age of 5 days. The stool should be soft and yellow.  At least 3 stools in a 24-hour period by the age of 7 days. The stool should be seedy and yellow.  No loss of weight greater than 10% of  birth weight during the first 3 days of life.  Average weight gain of 4-7 oz (113-198 g) per week after the age of 4 days.  Consistent daily weight gain by the age of 5 days, without weight loss after the age of 2 weeks. After a feeding, your baby may spit up a small amount of milk. This is normal. Breastfeeding frequency and duration Frequent feeding will help you make more milk and can prevent sore nipples and extremely full breasts (breast engorgement). Breastfeed when you feel the need to reduce the fullness of your breasts or when your baby shows signs of hunger. This is called "breastfeeding on demand." Signs that your baby is hungry include:  Increased alertness, activity, or restlessness.  Movement of the head from side to side.  Opening of the mouth when the corner of the mouth or cheek is stroked (rooting).  Increased sucking sounds, smacking lips, cooing, sighing, or squeaking.  Hand-to-mouth movements and sucking on fingers or hands.  Fussing or crying. Avoid introducing a pacifier to your baby in the first 4-6 weeks after your baby is born. After this time, you may choose to use a pacifier. Research has shown that pacifier use during the first year of a baby's life decreases the risk of sudden infant death syndrome (SIDS). Allow your baby to feed on each breast as long as he or she wants. When your baby unlatches or falls asleep while feeding from the first breast, offer the second breast. Because newborns are often sleepy in the first few weeks of life, you may need to awaken your baby to get him or her to feed. Breastfeeding times will vary from baby to baby. However, the following rules can serve as a guide to help you make sure that your baby is properly fed:  Newborns (babies 4 weeks of age or younger) may breastfeed every 1-3 hours.  Newborns should not go without breastfeeding for longer than 3 hours during the day or 5 hours during the night.  You should breastfeed  your baby a minimum of 8 times in a 24-hour period. Breast milk pumping     Pumping and storing breast milk allows you to make sure that your baby is exclusively fed your breast milk, even at times when you are unable to breastfeed. This is especially important if you go back to work while you are still breastfeeding, or if you are not able to be present during feedings. Your lactation consultant can help you find a method of pumping that works best for you and give you guidelines about how long it is safe to store breast milk. Caring for your breasts while you breastfeed Nipples can become dry, cracked, and sore while breastfeeding. The following recommendations can help keep your breasts moisturized and healthy:  Avoid using soap on your nipples.  Wear a supportive bra designed especially for nursing. Avoid wearing underwire-style bras or extremely tight bras (sports bras).  Air-dry your nipples for 3-4 minutes after each feeding.  Use only cotton bra pads to absorb leaked breast milk. Leaking of breast milk between feedings is normal.  Use lanolin on your nipples   after breastfeeding. Lanolin helps to maintain your skin's normal moisture barrier. Pure lanolin is not harmful (not toxic) to your baby. You may also hand express a few drops of breast milk and gently massage that milk into your nipples and allow the milk to air-dry. In the first few weeks after giving birth, some women experience breast engorgement. Engorgement can make your breasts feel heavy, warm, and tender to the touch. Engorgement peaks within 3-5 days after you give birth. The following recommendations can help to ease engorgement:  Completely empty your breasts while breastfeeding or pumping. You may want to start by applying warm, moist heat (in the shower or with warm, water-soaked hand towels) just before feeding or pumping. This increases circulation and helps the milk flow. If your baby does not completely empty your  breasts while breastfeeding, pump any extra milk after he or she is finished.  Apply ice packs to your breasts immediately after breastfeeding or pumping, unless this is too uncomfortable for you. To do this: ? Put ice in a plastic bag. ? Place a towel between your skin and the bag. ? Leave the ice on for 20 minutes, 2-3 times a day.  Make sure that your baby is latched on and positioned properly while breastfeeding. If engorgement persists after 48 hours of following these recommendations, contact your health care provider or a lactation consultant. Overall health care recommendations while breastfeeding  Eat 3 healthy meals and 3 snacks every day. Well-nourished mothers who are breastfeeding need an additional 450-500 calories a day. You can meet this requirement by increasing the amount of a balanced diet that you eat.  Drink enough water to keep your urine pale yellow or clear.  Rest often, relax, and continue to take your prenatal vitamins to prevent fatigue, stress, and low vitamin and mineral levels in your body (nutrient deficiencies).  Do not use any products that contain nicotine or tobacco, such as cigarettes and e-cigarettes. Your baby may be harmed by chemicals from cigarettes that pass into breast milk and exposure to secondhand smoke. If you need help quitting, ask your health care provider.  Avoid alcohol.  Do not use illegal drugs or marijuana.  Talk with your health care provider before taking any medicines. These include over-the-counter and prescription medicines as well as vitamins and herbal supplements. Some medicines that may be harmful to your baby can pass through breast milk.  It is possible to become pregnant while breastfeeding. If birth control is desired, ask your health care provider about options that will be safe while breastfeeding your baby. Where to find more information: [employer]: www.llli.org Contact a health care provider  if:  You feel like you want to stop breastfeeding or have become frustrated with breastfeeding.  Your nipples are cracked or bleeding.  Your breasts are red, tender, or warm.  You have: ? Painful breasts or nipples. ? A swollen area on either breast. ? A fever or chills. ? Nausea or vomiting. ? Drainage other than breast milk from your nipples.  Your breasts do not become full before feedings by the fifth day after you give birth.  You feel sad and depressed.  Your baby is: ? Too sleepy to eat well. ? Having trouble sleeping. ? More than 1 week old and wetting fewer than 6 diapers in a 24-hour period. ? Not gaining weight by 5 days of age.  Your baby has fewer than 3 stools in a 24-hour period.  Your baby's skin or   the white parts of his or her eyes become yellow. Get help right away if:  Your baby is overly tired (lethargic) and does not want to wake up and feed.  Your baby develops an unexplained fever. Summary  Breastfeeding offers many health benefits for infant and mothers.  Try to breastfeed your infant when he or she shows early signs of hunger.  Gently tickle or stroke your baby's lips with your finger or nipple to allow the baby to open his or her mouth. Bring the baby to your breast. Make sure that much of the areola is in your baby's mouth. Offer one side and burp the baby before you offer the other side.  Talk with your health care provider or lactation consultant if you have questions or you face problems as you breastfeed. This information is not intended to replace advice given to you by your health care provider. Make sure you discuss any questions you have with your health care provider. Document Revised: 02/16/2018 Document Reviewed: 12/24/2016 Elsevier Patient Education  2020 Elsevier Inc.  

## 2020-11-20 NOTE — BH Specialist Note (Signed)
Integrated Behavioral Health via Telemedicine Visit  11/24/2020 Jill Mullen [id number]  Number of Integrated Behavioral Health visits: 3 Session Start time: 2:18  Session End time: 2:50 Total time: 32  Referring Provider: Merian Capron, MD Patient/Family location: Home [facility] Provider location: [facility]  All persons participating in visit: Patient Jill Mullen and Jill Mullen   Types of Service: Individual psychotherapy  I connected with Jill Mullen and/or Jill Mullen's n/a by Telephone  (Video is Caregility application) and verified that I am speaking with the correct person using two identifiers.Discussed confidentiality: Yes   I discussed the limitations of telemedicine and the availability of in person appointments.  Discussed there is a possibility of technology failure and discussed alternative modes of communication if that failure occurs.  I discussed that engaging in this telemedicine visit, they consent to the provision of behavioral healthcare and the services will be billed under their insurance.  Patient and/or legal guardian expressed understanding and consented to Telemedicine visit: Yes   Presenting Concerns: Patient and/or family reports the following symptoms/concerns: Pt states her primary concern is lack of quality sleep, as she adjusts to husband being gone for new 3rd shift job; she is using relaxation breathing exercises and taking Zoloft as presctibed;  no panic attacks in over two weeks.  Duration of problem: Current pregnancy; Severity of problem: moderate  Patient and/or Family's Strengths/Protective Factors: Social connections, Concrete supports in place (healthy food, safe environments, etc.) and Sense of purpose  Goals Addressed: Patient will: 1.  Reduce symptoms of: anxiety, depression and stress  2.  Increase knowledge and/or ability of: healthy habits  3.   Demonstrate ability to: Increase healthy adjustment to current life circumstances and Increase motivation to adhere to plan of care  Progress towards Goals: Ongoing  Interventions: Interventions utilized:  Solution-Focused Strategies Standardized Assessments completed: Not Needed  Patient and/or Family Response: Pt agrees to updated treatment plan  Assessment: Patient currently experiencing Adjustment disorder with mixed anxiety and depression and Psychosocial stress.   Patient may benefit from continued psychoeducation and brief therapeutic interventions regarding coping with symptoms of anxiety, depression, stress .  Plan: 1. Follow up with behavioral health clinician on : Two weeks 2. Behavioral recommendations:  -Continue taking prenatal vitamin daily, as recommended by medical provider; talk to medical provider about adding melatonin  -Continue using relaxation breathing exercise daily as needed -Consider going outside for 20 minutes first hour upon waking, along with using sleep sounds at bedtime  (to help improve family) -Continue using community resources from [facility] (10/22/20 visit) 3. Referral(s): Integrated [employer] (In Clinic)  I discussed the assessment and treatment plan with the patient and/or parent/guardian. They were provided an opportunity to ask questions and all were answered. They agreed with the plan and demonstrated an understanding of the instructions.   They were advised to call back or seek an in-person evaluation if the symptoms worsen or if the condition fails to improve as anticipated.  Jill Close [name], LCSW

## 2020-11-24 ENCOUNTER — Ambulatory Visit (INDEPENDENT_AMBULATORY_CARE_PROVIDER_SITE_OTHER): Payer: Medicaid Other | Admitting: Clinical

## 2020-11-24 DIAGNOSIS — F4323 Adjustment disorder with mixed anxiety and depressed mood: Secondary | ICD-10-CM | POA: Diagnosis not present

## 2020-11-24 DIAGNOSIS — Z658 Other specified problems related to psychosocial circumstances: Secondary | ICD-10-CM | POA: Diagnosis not present

## 2020-11-24 NOTE — BH Specialist Note (Signed)
Integrated Behavioral Health via Telemedicine Visit  11/24/2020 Yazaira Speas [id number]  Number of Integrated Behavioral Health visits: 4 Session Start time: 3:21  Session End time: 3:52 Total time: 31  Referring Provider: Merian Capron, MD Patient/Family location: Home [facility] Provider location: [facility]  All persons participating in visit: Patient Jrue Yambao and [facility] [place]   Types of Service: Telephone visit  I connected with [name] and/or Carrell Satz's n/a by Video  (Video is Caregility application) and verified that I am speaking with the correct person using two identifiers.Discussed confidentiality: Yes   I discussed the limitations of telemedicine and the availability of in person appointments.  Discussed there is a possibility of technology failure and discussed alternative modes of communication if that failure occurs.  I discussed that engaging in this telemedicine visit, they consent to the provision of behavioral healthcare and the services will be billed under their insurance.  Patient and/or legal guardian expressed understanding and consented to Telemedicine visit: Yes   Presenting Concerns: Patient and/or family reports the following symptoms/concerns: Pt states her primary concern today is sleep getting worse, attributed to waking up struggling to breathe (cold) and dry mouth, physically uncomfortable with "emotions and depression in the night" along with feeling overwhelmed and anxiety with panic attack thinking about upcoming childbirth. Pt took melatonin for 4 days with no improvement in sleep; requests increase in Zoloft.  Duration of problem: Current pregnancy; Severity of problem: moderate  Patient and/or Family's Strengths/Protective Factors: Social connections, Concrete supports in place (healthy food, safe environments, etc.) and Sense of purpose  Goals  Addressed: Patient will: 1.  Reduce symptoms of: anxiety, depression and stress  2.  Demonstrate ability to: Increase healthy adjustment to current life circumstances  Progress towards Goals: Revised  Interventions: Interventions utilized:  Solution-Focused Strategies and Psychoeducation and/or Health Education Standardized Assessments completed: Not Needed  Patient and/or Family Response: Pt agrees to revised treatment plan  Assessment: Patient currently experiencing Adjustment disorder with mixed anxious and depressed mood and Psychosocial stress.   Patient may benefit from continued psychoeducation and brief therapeutic interventions regarding coping with symptoms of anxiety, depression and life stress .  Plan: 1. Follow up with behavioral health clinician on : Two weeks 2. Behavioral recommendations:  -Continue taking Zoloft and prenatal vitamin as prescribed (either medical provider or [facility] will contact regarding increase in medication) -Consider using humidifier at night to see if this helps with cold symptoms and dry mouth at night -Continue plan to finish master's degree program postpartum  3. Referral(s): Integrated [employer] (In Clinic)  I discussed the assessment and treatment plan with the patient and/or parent/guardian. They were provided an opportunity to ask questions and all were answered. They agreed with the plan and demonstrated an understanding of the instructions.   They were advised to call back or seek an in-person evaluation if the symptoms worsen or if the condition fails to improve as anticipated.  Rae Lips, LCSW   Depression screen [facility] 2/9 11/19/2020 11/05/2020 10/22/2020 09/12/2020 03/27/2020  Decreased Interest 2 1 2 1 1   Down, Depressed, Hopeless 2 1 3 1 3   PHQ - 2 Score 4 2 5 2 4   Altered sleeping 1 3 2  0 3  Tired, decreased energy 0 1 3 0 2  Change in appetite 1 2 2  0 2  Feeling bad or failure about yourself  0 2 3 0 1   Trouble concentrating 0 1  1 0 1  Moving slowly or fidgety/restless 0 0 0 0 0  Suicidal thoughts 0 0 0 0 0  PHQ-9 Score 6 11 16 2 13    GAD 7 : Generalized Anxiety Score 11/19/2020 11/05/2020 10/22/2020 09/12/2020  Nervous, Anxious, on Edge 1 3 1 1   Control/stop worrying 1 3 1  0  Worry too much - different things 1 3 1  0  Trouble relaxing 2 1 1 1   Restless 1 1 1  0  Easily annoyed or irritable 2 0 1 1  Afraid - awful might happen 0 0 0 0  Total GAD 7 Score 8 11 6  3

## 2020-12-01 ENCOUNTER — Ambulatory Visit: Payer: Medicaid Other | Attending: Obstetrics and Gynecology

## 2020-12-01 ENCOUNTER — Other Ambulatory Visit: Payer: Self-pay

## 2020-12-01 ENCOUNTER — Ambulatory Visit: Payer: Medicaid Other | Admitting: *Deleted

## 2020-12-01 ENCOUNTER — Other Ambulatory Visit: Payer: Self-pay | Admitting: *Deleted

## 2020-12-01 ENCOUNTER — Encounter: Payer: Self-pay | Admitting: *Deleted

## 2020-12-01 DIAGNOSIS — Z8759 Personal history of other complications of pregnancy, childbirth and the puerperium: Secondary | ICD-10-CM

## 2020-12-01 DIAGNOSIS — O09899 Supervision of other high risk pregnancies, unspecified trimester: Secondary | ICD-10-CM

## 2020-12-01 DIAGNOSIS — O10919 Unspecified pre-existing hypertension complicating pregnancy, unspecified trimester: Secondary | ICD-10-CM

## 2020-12-01 DIAGNOSIS — E669 Obesity, unspecified: Secondary | ICD-10-CM

## 2020-12-01 DIAGNOSIS — O09212 Supervision of pregnancy with history of pre-term labor, second trimester: Secondary | ICD-10-CM

## 2020-12-01 DIAGNOSIS — O0990 Supervision of high risk pregnancy, unspecified, unspecified trimester: Secondary | ICD-10-CM

## 2020-12-01 DIAGNOSIS — Z3A19 19 weeks gestation of pregnancy: Secondary | ICD-10-CM | POA: Insufficient documentation

## 2020-12-01 DIAGNOSIS — O10012 Pre-existing essential hypertension complicating pregnancy, second trimester: Secondary | ICD-10-CM | POA: Insufficient documentation

## 2020-12-01 DIAGNOSIS — O99212 Obesity complicating pregnancy, second trimester: Secondary | ICD-10-CM

## 2020-12-01 DIAGNOSIS — Z3687 Encounter for antenatal screening for uncertain dates: Secondary | ICD-10-CM

## 2020-12-01 DIAGNOSIS — Z6841 Body Mass Index (BMI) 40.0 and over, adult: Secondary | ICD-10-CM

## 2020-12-06 NOTE — L&D Delivery Note (Signed)
OB/GYN Faculty Practice Delivery Note  Alexzandrea Normington is a 35 y.o. [id number] s/p SVD at 101w4d. She was admitted for IOL for cHTN.   ROM: 20:57 clear fluid  GBS Status:  Negative/-- (04/20 1033) Maximum Maternal Temperature: 98.6  Labor Progress: . Initial SVE: 3 cm. Received two doses of cytotec. She then progressed to complete.   Delivery Date/Time: 20:58 on 5/7  Delivery: Called to room and patient was complete and pushing. Head delivered LOA. Loose nuchal cord present. Shoulder and body delivered in usual fashion. Infant with spontaneous cry, placed on mother's abdomen, dried and stimulated. Cord clamped x 2 after 1-minute delay, and cut by FOB. Cord blood drawn. Placenta delivered spontaneously with gentle cord traction. Brisk bleeding after delivery however patient firmly declined pitocin despite recommendation. Fundus boggy at umbilicus and improved with massage. 1g TXA and Methergine given with improvement in bleeding. Labia, perineum, vagina, and cervix inspected inspected with no lacerations. PP IUD deferred due to brisk bleeding and patient discomfort.  Baby Weight: pending  Placenta: Sent to L&D Complications: None Lacerations: none EBL: 300 mL Analgesia: none    Infant:  APGAR (1 MIN):  8 APGAR (5 MINS): 9  APGAR (10 MINS):     Casper Harrison, MD [facility] Family Medicine Fellow, [facility], [facility] 04/11/2021, 10:01 PM

## 2020-12-08 ENCOUNTER — Ambulatory Visit (INDEPENDENT_AMBULATORY_CARE_PROVIDER_SITE_OTHER): Payer: Medicaid Other | Admitting: Clinical

## 2020-12-08 DIAGNOSIS — Z658 Other specified problems related to psychosocial circumstances: Secondary | ICD-10-CM

## 2020-12-08 DIAGNOSIS — F4323 Adjustment disorder with mixed anxiety and depressed mood: Secondary | ICD-10-CM

## 2020-12-09 ENCOUNTER — Other Ambulatory Visit: Payer: Self-pay | Admitting: Family Medicine

## 2020-12-09 MED ORDER — SERTRALINE HCL 50 MG PO TABS: 50.0000 mg | ORAL_TABLET | Freq: Every day | ORAL | 5 refills | Status: DC

## 2020-12-09 NOTE — Progress Notes (Signed)
Increasing zoloft per patient request

## 2020-12-11 NOTE — BH Specialist Note (Deleted)
Integrated Behavioral Health via Telemedicine Visit  12/11/2020 Neveah Bang [id number]  Number of Integrated Behavioral Health visits: *** Session Start time: 10:15***  Session End time: 10:45*** Total time: {IBH Total Time:21014050}  Referring Provider: *** Patient/Family location: Home*** [facility] Provider location: [facility]  All persons participating in visit: Patient *** and [facility] Atianna Haidar ***  Types of Service: {CHL AMB TYPE OF [phone]}  I connected with [name] and/or Holley Rigg's {family members:20773} by Telephone  (Video is Caregility application) and verified that I am speaking with the correct person using two identifiers.Discussed confidentiality: {YES/NO:21197}  I discussed the limitations of telemedicine and the availability of in person appointments.  Discussed there is a possibility of technology failure and discussed alternative modes of communication if that failure occurs.  I discussed that engaging in this telemedicine visit, they consent to the provision of behavioral healthcare and the services will be billed under their insurance.  Patient and/or legal guardian expressed understanding and consented to Telemedicine visit: {YES/NO:21197}  Presenting Concerns: Patient and/or family reports the following symptoms/concerns: *** Duration of problem: ***; Severity of problem: {Mild/Moderate/Severe:20260}  Patient and/or Family's Strengths/Protective Factors: {CHL AMB BH PROTECTIVE FACTORS:[phone]}  Goals Addressed: Patient will: 1.  Reduce symptoms of: {IBH Symptoms:21014056}  2.  Increase knowledge and/or ability of: {IBH Patient Tools:21014057}  3.  Demonstrate ability to: {IBH Goals:21014053}  Progress towards Goals: {CHL AMB BH PROGRESS TOWARDS [phone]}  Interventions: Interventions utilized:  {IBH Interventions:21014054} Standardized Assessments  completed: {IBH Screening Tools:21014051}  Patient and/or Family Response: ***  Assessment: Patient currently experiencing ***.   Patient may benefit from ***.  Plan: 1. Follow up with behavioral health clinician on : *** 2. Behavioral recommendations: *** 3. Referral(s): {IBH Referrals:21014055}  I discussed the assessment and treatment plan with the patient and/or parent/guardian. They were provided an opportunity to ask questions and all were answered. They agreed with the plan and demonstrated an understanding of the instructions.   They were advised to call back or seek an in-person evaluation if the symptoms worsen or if the condition fails to improve as anticipated.  Valetta Close Mercie Balsley, LCSW

## 2020-12-17 ENCOUNTER — Encounter: Payer: Self-pay | Admitting: Obstetrics and Gynecology

## 2020-12-17 ENCOUNTER — Other Ambulatory Visit: Payer: Self-pay

## 2020-12-17 ENCOUNTER — Ambulatory Visit (INDEPENDENT_AMBULATORY_CARE_PROVIDER_SITE_OTHER): Payer: Medicaid Other | Admitting: Obstetrics and Gynecology

## 2020-12-17 VITALS — BP 140/70 | HR 97 | Wt 322.2 lb

## 2020-12-17 DIAGNOSIS — O0990 Supervision of high risk pregnancy, unspecified, unspecified trimester: Secondary | ICD-10-CM

## 2020-12-17 DIAGNOSIS — Z6841 Body Mass Index (BMI) 40.0 and over, adult: Secondary | ICD-10-CM

## 2020-12-17 DIAGNOSIS — F419 Anxiety disorder, unspecified: Secondary | ICD-10-CM | POA: Insufficient documentation

## 2020-12-17 DIAGNOSIS — Z3A22 22 weeks gestation of pregnancy: Secondary | ICD-10-CM | POA: Diagnosis not present

## 2020-12-17 DIAGNOSIS — O09899 Supervision of other high risk pregnancies, unspecified trimester: Secondary | ICD-10-CM

## 2020-12-17 DIAGNOSIS — F32A Depression, unspecified: Secondary | ICD-10-CM

## 2020-12-17 DIAGNOSIS — O10919 Unspecified pre-existing hypertension complicating pregnancy, unspecified trimester: Secondary | ICD-10-CM

## 2020-12-17 LAB — POCT URINALYSIS DIP (DEVICE)
Glucose, UA: NEGATIVE mg/dL
Hgb urine dipstick: NEGATIVE
Nitrite: NEGATIVE
Protein, ur: 30 mg/dL — AB
Specific Gravity, Urine: >=1.03 (ref 1.005–1.030)
Urobilinogen, UA: 0.2 mg/dL (ref 0.0–1.0)
pH: 6 (ref 5.0–8.0)

## 2020-12-17 NOTE — Progress Notes (Signed)
Prenatal Visit Note Date: 12/17/2020 Clinic: [facility]  Subjective:  Jill Mullen is a 35 y.o. [id number] at [redacted]w[redacted]d being seen today for ongoing prenatal care.  She is currently monitored for the following issues for this high-risk pregnancy and has Tobacco use; Supervision of high risk pregnancy, antepartum; Chronic hypertension affecting pregnancy; History of preterm delivery, currently pregnant; BMI 45.0-49.9, adult (HCC); Hx of preeclampsia, prior pregnancy, currently pregnant; Nausea and vomiting during pregnancy; and Anxiety and depression on their problem list.  Patient reports occasional low belly pressure.   Contractions: Not present. Vag. Bleeding: None.  Movement: Present. Denies leaking of fluid.   The following portions of the patient's history were reviewed and updated as appropriate: allergies, current medications, past family history, past medical history, past social history, past surgical history and problem list. Problem list updated.  Objective:   Vitals:   12/17/20 0830  BP: 140/70  Pulse: 97  Weight: (!) 322 lb 3.2 oz (146.1 kg)    Fetal Status: Fetal Heart Rate (bpm): 145   Movement: Present     General:  Alert, oriented and cooperative. Patient is in no acute distress.  Skin: Skin is warm and dry. No rash noted.   Cardiovascular: Normal heart rate noted  Respiratory: Normal respiratory effort, no problems with respiration noted  Abdomen: Soft, gravid, appropriate for gestational age. Pain/Pressure: Present     Pelvic:  Cervical exam deferred        Extremities: Normal range of motion.  Edema: Trace  Mental Status: Normal mood and affect. Normal behavior. Normal judgment and thought content.   Urinalysis:      Assessment and Plan:  Pregnancy: [id number] at [redacted]w[redacted]d  1. [redacted] weeks gestation of pregnancy  2. Supervision of high risk pregnancy, antepartum Continue low dose asa. IUD vs OCPs  3. Chronic hypertension affecting  pregnancy Doing well on procardia xl 30 qday Has growth scan in two weeks; continue with qmonth growth u/s and start weekly testing at 32wks. Delivery around 39wks  4. BMI 45.0-49.9, adult (HCC) Weight stable  5. History of preterm delivery, currently pregnant Not on 17p.  6. Anxiety and depression Stable. Sharen Heck on Friday. Pt on no meds  Preterm labor symptoms and general obstetric precautions including but not limited to vaginal bleeding, contractions, leaking of fluid and fetal movement were reviewed in detail with the patient. Please refer to After Visit Summary for other counseling recommendations.  Return in about 1 month (around 01/17/2021) for in person, md or app.   [place], MD

## 2020-12-19 ENCOUNTER — Telehealth: Payer: Self-pay | Admitting: Clinical

## 2020-12-19 NOTE — Telephone Encounter (Signed)
Intern called Pt to reschedule appt for 12/22/20 due to inclement weather. Pt was rescheduled for 12/30/20 at 9:15AM. Intern informed that the appt will be virtual and she will receive a link for the appt.  Bea Graff (Supervisor: Hulda Marin)

## 2020-12-23 NOTE — BH Specialist Note (Signed)
Integrated Behavioral Health via Telemedicine Visit  12/23/2020 Lexandra Rettke [id number]  Number of Integrated Behavioral Health visits: 1 Session Start time: 9:16  Session End time: 9:35 Total time: 36  Referring Provider: Merian Capron, MD Patient/Family location: Home [facility] Provider location: [facility]  All persons participating in visit: Patient Analyah Mcconnon and [facility] Athanasia Stanwood   Types of Service: Telephone visit  I connected with [name] and/or Cassie Brissette's n/a by Telephone  (Video is Caregility application) and verified that I am speaking with the correct person using two identifiers.Discussed confidentiality: Yes   I discussed the limitations of telemedicine and the availability of in person appointments.  Discussed there is a possibility of technology failure and discussed alternative modes of communication if that failure occurs.  I discussed that engaging in this telemedicine visit, they consent to the provision of behavioral healthcare and the services will be billed under their insurance.  Patient and/or legal guardian expressed understanding and consented to Telemedicine visit: Yes   Presenting Concerns: Patient and/or family reports the following symptoms/concerns: Pt states she is taking the increased dosage of Zoloft 50mg  daily for the past week with no known side effects, and feels "more balanced", sleep is improving, with no additional concern at this time.  Duration of problem: Current pregnancy; Severity of problem: mild  Patient and/or Family's Strengths/Protective Factors: Social connections, Concrete supports in place (healthy food, safe environments, etc.) and Sense of purpose  Goals Addressed: Patient will: 1.  Maintain reduction in symptoms of: anxiety, depression and stress   Progress towards Goals: Ongoing  Interventions: Interventions utilized:  Medication  Monitoring and Psychoeducation and/or Health Education Standardized Assessments completed: GAD-7 and PHQ 9  Patient and/or Family Response: Pt agrees to revised treatment plan  Assessment: Patient currently experiencing Adjustment disorder with mixed anxious and depressed mood and Psychosocial stress.   Patient may benefit from continued psychoeducation and brief therapeutic interventions regarding coping with symptoms of anxiety, depression, stress .  Plan: 1. Follow up with behavioral health clinician on : Two months; Call Prue Lingenfelter at [phone] if needed prior to scheduled visit 2. Behavioral recommendations:  -Continue taking Zoloft and prenatal vitamins daily as prescribed -Continue prioritizing healthy sleep nightly, for remainder of pregnancy -Continue with plan to re-enroll in school (to begin postpartum) -Continue using self-coping strategies, as needed throughout the day, to manage emotions  3. Referral(s): Integrated [phone] (In Clinic)  I discussed the assessment and treatment plan with the patient and/or parent/guardian. They were provided an opportunity to ask questions and all were answered. They agreed with the plan and demonstrated an understanding of the instructions.   They were advised to call back or seek an in-person evaluation if the symptoms worsen or if the condition fails to improve as anticipated.  [employer], LCSW   Depression screen [facility] 2/9 12/30/2020 12/17/2020 12/17/2020 11/19/2020 11/05/2020  Decreased Interest 0 2 2 2 1   Down, Depressed, Hopeless 1 1 1 2 1   PHQ - 2 Score 1 3 3 4 2   Altered sleeping 0 1 1 1 3   Tired, decreased energy 1 1 1  0 1  Change in appetite 0 2 2 1 2   Feeling bad or failure about yourself  1 1 1  0 2  Trouble concentrating 0 2 2 0 1  Moving slowly or fidgety/restless 0 2 2 0 0  Suicidal thoughts 0 0 1 0 0  PHQ-9 Score 3 12 13 6  11  Some recent data might be hidden   GAD 7 : Generalized Anxiety Score  12/30/2020 12/17/2020 11/19/2020 11/05/2020  Nervous, Anxious, on Edge 1 1 1 3   Control/stop worrying 1 1 1 3   Worry too much - different things 1 0 1 3  Trouble relaxing 0 0 2 1  Restless 0 1 1 1   Easily annoyed or irritable 0 3 2 0  Afraid - awful might happen 0 0 0 0  Total GAD 7 Score 3 6 8  11

## 2020-12-25 ENCOUNTER — Encounter: Payer: Self-pay | Admitting: Physician Assistant

## 2020-12-25 ENCOUNTER — Telehealth: Payer: Medicaid Other | Admitting: Physician Assistant

## 2020-12-25 VITALS — BP 138/75 | HR 110

## 2020-12-25 DIAGNOSIS — O26852 Spotting complicating pregnancy, second trimester: Secondary | ICD-10-CM

## 2020-12-25 NOTE — Patient Instructions (Signed)
Vaginal Bleeding During Pregnancy, Second Trimester °A small amount of bleeding from the vagina is common during pregnancy. This kind of bleeding is also called spotting. Sometimes the bleeding is normal and is not a sign of problems. In some other cases, it is a sign of something serious. °In the second trimester, normal bleeding can happen: °· Because of changes in your blood vessels. °· When you have sex. °· When you have pelvic exams. °In the second trimester, some abnormal things can cause bleeding. These include: °· Infection or swelling. °· Growths in the lowest part of the womb (cervix). These growths are also called polyps. °· Problems of the placenta. The placenta can block the opening of the cervix. The placenta can also break away from the womb. °· Miscarriage. °· Early labor. °· The cervix that opens early, before labor. °· An egg that was not fertilized properly. This causes a type of pregnancy called molar pregnancy. °Tell your doctor right away if there is any bleeding from your vagina. °Follow these instructions at home: °Watch your bleeding °· Watch your condition for any changes. Let your doctor know if you are worried about something. °· Try to know what causes your bleeding. Ask yourself these questions: °? Does the bleeding start on its own? °? Does the bleeding start after something is done, such as sex or a pelvic exam? °· Use a diary to write down the things you see about your bleeding. Write in your diary: °? If the bleeding flows freely without stopping, or if it starts and stops, and then starts again. °? If the bleeding is heavy or light. °? How many pads you use in a day and how much blood is in them. °· Tell your doctor if you pass tissue. He or she may want to see it.   °Activity °· Follow your doctor's instructions about limiting your activities. Ask what activities are safe for you. °· Do not exercise or do activities that take a lot of effort until your doctor says that this is  safe. °· Do not have sex until your doctor says that this is safe. °· Do not lift anything that is heavier than 10 lb (4.5 kg), or the limit that you are told. °· If needed, make plans for someone to help with your normal activities. °Medicines °· Take over-the-counter and prescription medicines only as told by your doctor. °· Do not take aspirin. It can cause bleeding. °General instructions °· Do not use tampons. °· Do not douche. °· Keep all follow-up visits. °Contact a doctor if: °· You have bleeding in the vagina at any time during pregnancy. °· You have cramps. °· You have a fever that does not get better with medicine. °Get help right away if: °· You have very bad cramps in your back or belly (abdomen). °· You have contractions. °· You have chills. °· Your bleeding gets worse. °· You pass large clots or a lot of tissue from your vagina. °· You feel light-headed. °· You feel weak. °· You faint. °· You are leaking fluid from your vagina. °· You have a gush of fluid from your vagina. °Summary °· A small amount of bleeding during pregnancy is normal. But bleeding can be a sign of something serious. Tell your doctor right away about any bleeding from your vagina. °· Try to know what causes your bleeding. Does the bleeding occur on its own, or does it occur after something is done, such as sex or pelvic exams? °·   Follow your doctor's instructions about what activities you can do.  Keep all follow-up visits. This information is not intended to replace advice given to you by your health care provider. Make sure you discuss any questions you have with your health care provider. Document Revised: 08/14/2020 Document Reviewed: 08/14/2020 Elsevier Patient Education  2021 [employer].

## 2020-12-25 NOTE — Progress Notes (Addendum)
I connected with@ on 12/25/20 at  1:45 PM EST by a video enabled telemedicine application and verified that I am speaking with the correct person using two identifiers.  I discussed the limitations of evaluation and management by telemedicine and the availability of in person appointments. The patient expressed understanding and agreed to proceed.    MyChart Video Visit    Virtual Visit via Video Note   This visit type was conducted due to national recommendations for restrictions regarding the COVID-19 Pandemic (e.g. social distancing) in an effort to limit this patient's exposure and mitigate transmission in our community. This patient is at least at moderate risk for complications without adequate follow up. This format is felt to be most appropriate for this patient at this time. Physical exam was limited by quality of the video and audio technology used for the visit.   Patient location: Home Provider location: Home office in [place], [place]  I discussed the limitations of evaluation and management by telemedicine and the availability of in person appointments. The patient expressed understanding and agreed to proceed.  Patient: Jill Mullen   DOB: 1986-03-07   35 y.o. Female  MRN: [id number] Visit Date: 12/25/2020  Today's healthcare provider: Mar Daring, PA-C   No chief complaint on file.  Subjective    HPI  Jill Mullen is a 36 yr old female that presented today via mychart video visit for spotting during pregnancy. She is currently [redacted]w[redacted]d. She reports this morning she used the restroom (urination only) and noticed some blood on the tissue paper when she wiped. She reports it was a very small amount and was pink in coloration. She did not have a BM with this episode. She denies any abdominal cramping, nausea, vomiting, urinary changes, bowel changes, fever, chills. She reports this was the only incident and she has not noticed any other spotting. She is followed by  [facility] and has a routine [place] scheduled for Monday, 12/29/20.  Patient Active Problem List   Diagnosis Date Noted  . Anxiety and depression 12/17/2020  . BMI 45.0-49.9, adult ([place]) 10/22/2020  . Hx of preeclampsia, prior pregnancy, currently pregnant 10/22/2020  . Nausea and vomiting during pregnancy 10/22/2020  . Supervision of high risk pregnancy, antepartum 09/12/2020  . Chronic hypertension affecting pregnancy   . History of preterm delivery, currently pregnant   . Tobacco use 03/23/2014   Past Medical History:  Diagnosis Date  . History of gestational hypertension   . Hypertension   . Pregnancy induced hypertension   . Preterm labor   . Vaginal Pap smear, abnormal 2005      Medications: Outpatient Medications Prior to Visit  Medication Sig  . aspirin 81 MG chewable tablet Chew 1 tablet (81 mg total) by mouth daily.  . Blood Pressure Monitoring (BLOOD PRESSURE KIT) DEVI 1 Device by Does not apply route as needed.  Marland Kitchen NIFEdipine (ADALAT CC) 30 MG 24 hr tablet Take 30 mg by mouth daily.  Marland Kitchen omeprazole (PRILOSEC) 40 MG capsule Take 1 capsule (40 mg total) by mouth daily.  . Prenatal Vit-Fe Fumarate-FA (MULTIVITAMIN-PRENATAL) 27-0.8 MG TABS tablet Take 1 tablet by mouth daily at 12 noon.  . promethazine (PHENERGAN) 25 MG tablet Take 1 tablet (25 mg total) by mouth every 6 (six) hours as needed for nausea or vomiting.  . sertraline (ZOLOFT) 50 MG tablet Take 1 tablet (50 mg total) by mouth daily.  . Vitamin D, Ergocalciferol, (DRISDOL) 1.25 MG (50000 UNIT) CAPS capsule Take 50,000 Units by  mouth once a week. (Patient not taking: Reported on 12/17/2020)   No facility-administered medications prior to visit.    Review of Systems  Last CBC Lab Results  Component Value Date   WBC 8.6 10/22/2020   HGB 12.3 10/22/2020   HCT 35.4 10/22/2020   MCV 87 10/22/2020   MCH 30.1 10/22/2020   RDW 14.7 10/22/2020   PLT 266 44/96/7591   Last metabolic panel Lab Results   Component Value Date   GLUCOSE 111 (H) 10/22/2020   NA 138 10/22/2020   K 3.8 10/22/2020   CL 103 10/22/2020   CO2 19 (L) 10/22/2020   BUN 7 10/22/2020   CREATININE 0.59 10/22/2020   GFRNONAA 121 10/22/2020   GFRAA 139 10/22/2020   CALCIUM 9.4 10/22/2020   PROT 7.5 10/22/2020   ALBUMIN 4.0 10/22/2020   LABGLOB 3.5 10/22/2020   AGRATIO 1.1 (L) 10/22/2020   BILITOT <0.2 10/22/2020   ALKPHOS 41 (L) 10/22/2020   AST 12 10/22/2020   ALT 19 10/22/2020      Objective    BP 138/75   Pulse (!) 110   LMP 06/29/2020 (Within Days)  BP Readings from Last 3 Encounters:  12/25/20 138/75  12/17/20 140/70  12/01/20 122/69   Wt Readings from Last 3 Encounters:  12/17/20 (!) 322 lb 3.2 oz (146.1 kg)  11/19/20 (!) 319 lb 9.6 oz (145 kg)  10/22/20 (!) 316 lb (143.3 kg)      Physical Exam Vitals reviewed.  Constitutional:      General: She is not in acute distress.    Appearance: Normal appearance. She is well-developed and well-nourished. She is not ill-appearing.  HENT:     Head: Normocephalic and atraumatic.  Eyes:     Extraocular Movements: EOM normal.  Pulmonary:     Effort: Pulmonary effort is normal. No respiratory distress.  Musculoskeletal:     Cervical back: Normal range of motion and neck supple.  Neurological:     Mental Status: She is alert.  Psychiatric:        Mood and Affect: Mood and affect and mood normal.        Behavior: Behavior normal.        Thought Content: Thought content normal.        Judgment: Judgment normal.        Assessment & Plan     1. Spotting affecting pregnancy in second trimester Isolated incident of spotting x 1 today. None since. No concerning signs or symptoms at this time. Advised patient to contact GYN just to notify of isolated incident. Patient already has [place] scheduled routinely for Monday, 12/29/20. Advised patient of warning signs and when to present to the ER. She was reassured and voiced understanding.    No follow-ups  on file.     I discussed the assessment and treatment plan with the patient. The patient was provided an opportunity to ask questions and all were answered. The patient agreed with the plan and demonstrated an understanding of the instructions.   The patient was advised to call back or seek an in-person evaluation if the symptoms worsen or if the condition fails to improve as anticipated.  I provided 9 minutes of face-to-face time during this encounter via MyChart Video enabled encounter.  Reynolds Bowl, PA-C, have reviewed all documentation for this visit. The documentation on 12/25/20 for the exam, diagnosis, procedures, and orders are all accurate and complete.  [place] [phone] (phone) [phone] (  fax)  [place]

## 2020-12-29 ENCOUNTER — Other Ambulatory Visit: Payer: Self-pay | Admitting: *Deleted

## 2020-12-29 ENCOUNTER — Other Ambulatory Visit: Payer: Self-pay

## 2020-12-29 ENCOUNTER — Ambulatory Visit: Payer: Medicaid Other | Attending: Obstetrics and Gynecology

## 2020-12-29 ENCOUNTER — Ambulatory Visit: Payer: Medicaid Other | Admitting: *Deleted

## 2020-12-29 ENCOUNTER — Encounter: Payer: Self-pay | Admitting: *Deleted

## 2020-12-29 DIAGNOSIS — O09212 Supervision of pregnancy with history of pre-term labor, second trimester: Secondary | ICD-10-CM

## 2020-12-29 DIAGNOSIS — E669 Obesity, unspecified: Secondary | ICD-10-CM

## 2020-12-29 DIAGNOSIS — O321XX0 Maternal care for breech presentation, not applicable or unspecified: Secondary | ICD-10-CM

## 2020-12-29 DIAGNOSIS — O10912 Unspecified pre-existing hypertension complicating pregnancy, second trimester: Secondary | ICD-10-CM | POA: Diagnosis not present

## 2020-12-29 DIAGNOSIS — O10919 Unspecified pre-existing hypertension complicating pregnancy, unspecified trimester: Secondary | ICD-10-CM | POA: Insufficient documentation

## 2020-12-29 DIAGNOSIS — Z3687 Encounter for antenatal screening for uncertain dates: Secondary | ICD-10-CM

## 2020-12-29 DIAGNOSIS — O09899 Supervision of other high risk pregnancies, unspecified trimester: Secondary | ICD-10-CM | POA: Diagnosis present

## 2020-12-29 DIAGNOSIS — Z3A23 23 weeks gestation of pregnancy: Secondary | ICD-10-CM

## 2020-12-29 DIAGNOSIS — O0990 Supervision of high risk pregnancy, unspecified, unspecified trimester: Secondary | ICD-10-CM | POA: Diagnosis present

## 2020-12-29 DIAGNOSIS — O99212 Obesity complicating pregnancy, second trimester: Secondary | ICD-10-CM | POA: Diagnosis not present

## 2020-12-30 ENCOUNTER — Telehealth: Payer: Medicaid Other | Admitting: Family Medicine

## 2020-12-30 ENCOUNTER — Ambulatory Visit (INDEPENDENT_AMBULATORY_CARE_PROVIDER_SITE_OTHER): Payer: Medicaid Other | Admitting: Clinical

## 2020-12-30 ENCOUNTER — Encounter: Payer: Self-pay | Admitting: Family Medicine

## 2020-12-30 ENCOUNTER — Telehealth: Payer: Self-pay | Admitting: Family Medicine

## 2020-12-30 DIAGNOSIS — F4323 Adjustment disorder with mixed anxiety and depressed mood: Secondary | ICD-10-CM | POA: Diagnosis not present

## 2020-12-30 DIAGNOSIS — Z658 Other specified problems related to psychosocial circumstances: Secondary | ICD-10-CM

## 2020-12-30 DIAGNOSIS — O26859 Spotting complicating pregnancy, unspecified trimester: Secondary | ICD-10-CM

## 2020-12-30 DIAGNOSIS — O26899 Other specified pregnancy related conditions, unspecified trimester: Secondary | ICD-10-CM | POA: Diagnosis not present

## 2020-12-30 DIAGNOSIS — R109 Unspecified abdominal pain: Secondary | ICD-10-CM

## 2020-12-30 NOTE — Patient Instructions (Addendum)
[facility] at [facility] [address] [place], [place] [address] [phone] (main office) [phone] (Jamie's office)   

## 2020-12-30 NOTE — Progress Notes (Signed)
Ms. Jill, Mullen are scheduled for a virtual visit with your provider today.    Just as we do with appointments in the office, we must obtain your consent to participate.  Your consent will be active for this visit and any virtual visit you may have with one of our providers in the next 365 days.    If you have a MyChart account, I can also send a copy of this consent to you electronically.  All virtual visits are billed to your insurance company just like a traditional visit in the office.  As this is a virtual visit, video technology does not allow for your provider to perform a traditional examination.  This may limit your provider's ability to fully assess your condition.  If your provider identifies any concerns that need to be evaluated in person or the need to arrange testing such as labs, EKG, etc, we will make arrangements to do so.    Although advances in technology are sophisticated, we cannot ensure that it will always work on either your end or our end.  If the connection with a video visit is poor, we may have to switch to a telephone visit.  With either a video or telephone visit, we are not always able to ensure that we have a secure connection.   I need to obtain your verbal consent now.   Are you willing to proceed with your visit today?   Jill Mullen has provided verbal consent on 12/30/2020 for a virtual visit (video or telephone).   Freddy Finner, NP 12/30/2020  2:13 PM   Date:  12/30/2020   ID:  Jill Mullen, DOB [birth year], MRN [id number]  Patient Location: Home Provider Location: Home Office   Participants: Patient and Provider for Visit and Wrap up  Method of visit: Video  Location of Patient: Home Location of Provider: Home Office Consent was obtain for visit over the video. Services rendered by provider: Visit was performed via video  A video enabled telemedicine application was used and I verified that I am speaking with the correct person using two  identifiers.  PCP:  Patient, No Pcp Per   Chief Complaint:  Vaginal bleeding and cramping [redacted] weeks pregnant.  History of Present Illness:    Jill Mullen is a 35 y.o. female with history as stated below. Presents video telehealth for an acute care visit due to vaginal bleeding and cramping.  Of note she presented back in Friday 12/26/20 due to vaginal bleeding as well. SHe had an episode of bleeding after urination and noted the blood on the tissue when she wiped. She did not see any in the bowl. She reported it was scant amount and pink in coloration. This was no associated with intercourse or a recent pelvic exam. Friday she denied having abdominal cramping, n/v. Changes in bowel or bladder habits, fevers or chills. This was the first incident as well. She was set to have an [place] Monday 12/29/20 routine in nature.  Today she reports more vaginal bleeding- as above. Not enough to wear a pad or liner. Only when going to the restroom. She reports her Korea was good and all went well. However, today is different due to new additional cramping- which she reports to be strong. She mychart messaged her office but has not heard back. She denies vomiting, but reports feeling nausea when cramping happened. She has not had intercourse or a pelvic exam recently She denies changes in bowel or bladder habits, fevers or chills.  Past Medical, Surgical, Social History, Allergies, and Medications have been Reviewed.  Past Medical History:  Diagnosis Date  . History of gestational hypertension   . Hypertension   . Pregnancy induced hypertension   . Preterm labor   . Vaginal Pap smear, abnormal 2005    No outpatient medications have been marked as taking for the 12/30/20 encounter (Appointment) with Freddy Finner, NP.     Allergies:   Penicillins   ROS See HPI for history of present illness.  Physical Exam Constitutional:      Appearance: Normal appearance.  HENT:     Head: Normocephalic and  atraumatic.     Nose: Nose normal.  Eyes:     Extraocular Movements: Extraocular movements intact.     Conjunctiva/sclera: Conjunctivae normal.     Pupils: Pupils are equal, round, and reactive to light.  Pulmonary:     Breath sounds: Normal breath sounds.  Musculoskeletal:        General: Normal range of motion.     Cervical back: Normal range of motion.  Neurological:     Mental Status: She is alert and oriented to person, place, and time.  Psychiatric:        Mood and Affect: Mood normal.        Behavior: Behavior normal.        Thought Content: Thought content normal.        Judgment: Judgment normal.               1. Spotting and cramping affecting pregnancy, antepartum Reoccurrences of spotting with cramping this time. She is advised to call her OBGYN to see if they would like to see her.  Korea was promising, but with new cramping onset- she is encouraged to follow up with ER if warning signs occur- AVS has attached signs to look for.  She reports verbal understanding and is going to call her OB once this visit ends.   Time:   Today, I have spent 15 minutes with the patient with telehealth technology discussing the above problems, reviewing the chart, previous notes, medications and orders.    Tests Ordered: No orders of the defined types were placed in this encounter.   Medication Changes: No orders of the defined types were placed in this encounter.    Disposition:  Follow up with OB -today  Signed, Freddy Finner, NP  12/30/2020 2:13 PM

## 2020-12-30 NOTE — Telephone Encounter (Signed)
Pt calling stating that she is 24 weeks preg and experiencing some pain, cramping and spotting. Request a call ASAP. thanks

## 2020-12-30 NOTE — Patient Instructions (Signed)
Please call OB    Vaginal Bleeding During Pregnancy, Second Trimester A small amount of bleeding from the vagina is common during pregnancy. This kind of bleeding is also called spotting. Sometimes the bleeding is normal and is not a sign of problems. In some other cases, it is a sign of something serious. In the second trimester, normal bleeding can happen:  Because of changes in your blood vessels.  When you have sex.  When you have pelvic exams. In the second trimester, some abnormal things can cause bleeding. These include:  Infection or swelling.  Growths in the lowest part of the womb (cervix). These growths are also called polyps.  Problems of the placenta. The placenta can block the opening of the cervix. The placenta can also break away from the womb.  Miscarriage.  Early labor.  The cervix that opens early, before labor.  An egg that was not fertilized properly. This causes a type of pregnancy called molar pregnancy. Tell your doctor right away if there is any bleeding from your vagina. Follow these instructions at home: Watch your bleeding  Watch your condition for any changes. Let your doctor know if you are worried about something.  Try to know what causes your bleeding. Ask yourself these questions: ? Does the bleeding start on its own? ? Does the bleeding start after something is done, such as sex or a pelvic exam?  Use a diary to write down the things you see about your bleeding. Write in your diary: ? If the bleeding flows freely without stopping, or if it starts and stops, and then starts again. ? If the bleeding is heavy or light. ? How many pads you use in a day and how much blood is in them.  Tell your doctor if you pass tissue. He or she may want to see it.   Activity  Follow your doctor's instructions about limiting your activities. Ask what activities are safe for you.  Do not exercise or do activities that take a lot of effort until your doctor  says that this is safe.  Do not have sex until your doctor says that this is safe.  Do not lift anything that is heavier than 10 lb (4.5 kg), or the limit that you are told.  If needed, make plans for someone to help with your normal activities. Medicines  Take over-the-counter and prescription medicines only as told by your doctor.  Do not take aspirin. It can cause bleeding. General instructions  Do not use tampons.  Do not douche.  Keep all follow-up visits. Contact a doctor if:  You have bleeding in the vagina at any time during pregnancy.  You have cramps.  You have a fever that does not get better with medicine. Get help right away if:  You have very bad cramps in your back or belly (abdomen).  You have contractions.  You have chills.  Your bleeding gets worse.  You pass large clots or a lot of tissue from your vagina.  You feel light-headed.  You feel weak.  You faint.  You are leaking fluid from your vagina.  You have a gush of fluid from your vagina. Summary  A small amount of bleeding during pregnancy is normal. But bleeding can be a sign of something serious. Tell your doctor right away about any bleeding from your vagina.  Try to know what causes your bleeding. Does the bleeding occur on its own, or does it occur after something is done,  such as sex or pelvic exams?  Follow your doctor's instructions about what activities you can do.  Keep all follow-up visits. This information is not intended to replace advice given to you by your health care provider. Make sure you discuss any questions you have with your health care provider. Document Revised: 08/14/2020 Document Reviewed: 08/14/2020 Elsevier Patient Education  2021 [employer].

## 2020-12-31 NOTE — Telephone Encounter (Signed)
Patient's concern addressed via mychart.

## 2021-01-01 ENCOUNTER — Ambulatory Visit (INDEPENDENT_AMBULATORY_CARE_PROVIDER_SITE_OTHER): Payer: Medicaid Other | Admitting: Obstetrics & Gynecology

## 2021-01-01 ENCOUNTER — Other Ambulatory Visit: Payer: Self-pay

## 2021-01-01 VITALS — BP 137/83 | HR 102 | Wt 330.3 lb

## 2021-01-01 DIAGNOSIS — O0990 Supervision of high risk pregnancy, unspecified, unspecified trimester: Secondary | ICD-10-CM

## 2021-01-01 DIAGNOSIS — F32A Depression, unspecified: Secondary | ICD-10-CM

## 2021-01-01 DIAGNOSIS — F419 Anxiety disorder, unspecified: Secondary | ICD-10-CM

## 2021-01-01 DIAGNOSIS — K611 Rectal abscess: Secondary | ICD-10-CM

## 2021-01-01 DIAGNOSIS — Z6841 Body Mass Index (BMI) 40.0 and over, adult: Secondary | ICD-10-CM

## 2021-01-01 DIAGNOSIS — O09899 Supervision of other high risk pregnancies, unspecified trimester: Secondary | ICD-10-CM

## 2021-01-01 DIAGNOSIS — Z3A24 24 weeks gestation of pregnancy: Secondary | ICD-10-CM

## 2021-01-01 DIAGNOSIS — O09299 Supervision of pregnancy with other poor reproductive or obstetric history, unspecified trimester: Secondary | ICD-10-CM

## 2021-01-01 DIAGNOSIS — O10919 Unspecified pre-existing hypertension complicating pregnancy, unspecified trimester: Secondary | ICD-10-CM

## 2021-01-01 LAB — POCT URINALYSIS DIP (DEVICE)
Bilirubin Urine: NEGATIVE
Glucose, UA: NEGATIVE mg/dL
Hgb urine dipstick: NEGATIVE
Ketones, ur: NEGATIVE mg/dL
Nitrite: NEGATIVE
Protein, ur: NEGATIVE mg/dL
Specific Gravity, Urine: 1.02 (ref 1.005–1.030)
Urobilinogen, UA: 0.2 mg/dL (ref 0.0–1.0)
pH: 7 (ref 5.0–8.0)

## 2021-01-01 MED ORDER — METRONIDAZOLE 500 MG PO TABS
500.0000 mg | ORAL_TABLET | Freq: Two times a day (BID) | ORAL | 0 refills | Status: DC
Start: 2021-01-01 — End: 2021-01-07

## 2021-01-01 MED ORDER — CEPHALEXIN 500 MG PO CAPS: 500.0000 mg | ORAL_CAPSULE | Freq: Three times a day (TID) | ORAL | 0 refills | Status: DC

## 2021-01-01 NOTE — Progress Notes (Signed)
PRENATAL VISIT NOTE  Subjective:  Jill Mullen is a 35 y.o. [phone] at [redacted]w[redacted]d being seen today for ongoing prenatal care.  She is currently monitored for the following issues for this high-risk pregnancy and has Supervision of high risk pregnancy, antepartum; Chronic hypertension affecting pregnancy; History of preterm delivery, currently pregnant; BMI 45.0-49.9, adult (HCC); Hx of preeclampsia, prior pregnancy, currently pregnant; Nausea and vomiting during pregnancy; Anxiety and depression; and Spotting and cramping affecting pregnancy, antepartum on their problem list.  Patient reports spotting that has occured for about 4- 5 days.  Was bright red and is now just pink.  Concerned about pregnancy.  Pt feels this is vaginal bleeding but is not completely sure.  Denies urinary symptoms.  Contractions: Not present.  Movement: Present. Denies leaking of fluid.   The following portions of the patient's history were reviewed and updated as appropriate: allergies, current medications, past family history, past medical history, past social history, past surgical history and problem list.   Objective:   Vitals:   01/01/21 1317  BP: 137/83  Pulse: (!) 102  Weight: (!) 330 lb 4.8 oz (149.8 kg)    Fetal Status: Fetal Heart Rate (bpm): 144   Movement: Present     General:  Alert, oriented and cooperative. Patient is in no acute distress.  Skin: Skin is warm and dry. No rash noted.   Cardiovascular: Normal heart rate noted  Respiratory: Normal respiratory effort, no problems with respiration noted  Abdomen: Soft, gravid, appropriate for gestational age.  Pain/Pressure: Present     Pelvic: cervix visualized with speculum and is closed and visibly long, no vaginal bleeding or discharge noted.  left perirectal abscess that is draining noted about 6cm from anus on left.        Extremities: Normal range of motion.     Mental Status: Normal mood and affect. Normal behavior. Normal judgment and  thought content.   Assessment and Plan:  Pregnancy: [id number] at [redacted]w[redacted]d 1. [redacted] weeks gestation of pregnancy - pt reassured she is not having vaginal bleeding and there is no concern about PTL with exam today.  Feel all her bleeding is from the draining abscess.  2. Perirectal abscess - WOUND CULTURE - metroNIDAZOLE (FLAGYL) 500 MG tablet; Take 1 tablet (500 mg total) by mouth 2 (two) times daily.  Dispense: 14 tablet; Refill: 0 - cephALEXin (KEFLEX) 500 MG capsule; Take 1 capsule (500 mg total) by mouth 3 (three) times daily.  Dispense: 21 capsule; Refill: 0 - Spoke with Dr. Lendon Ka, general surgeon, who felt trial of antibiotics reasonable but if not significantly improved, then she needed to have follow with general surgery and likely have opened/drained. -Will recheck next Wednesday  3. Supervision of high risk pregnancy, antepartum  4. Chronic hypertension affecting pregnancy - on ASA and nifedipine  5. History of preterm delivery, currently pregnant  6. Hx of preeclampsia, prior pregnancy, currently pregnan  7. BMI 45.0-49.9, adult (HCC)  8. Anxiety and depression  Preterm labor symptoms and general obstetric precautions including but not limited to vaginal bleeding, contractions, leaking of fluid and fetal movement were reviewed in detail with the patient. Please refer to After Visit Summary for other counseling recommendations.   Return in about 1 week (around 01/08/2021) for recheck perianal drainaing abscess.  Future Appointments  Date Time Provider Department Center  01/14/2021  3:35 PM Alafaya Bing, MD [facility]  01/27/2021 10:45 AM [facility] NURSE [facility]  01/27/2021 11:00 AM [facility] US1 [facility]  02/24/2021  9:15 AM [facility]    Jerene Bears, MD

## 2021-01-02 ENCOUNTER — Encounter: Payer: Self-pay | Admitting: Obstetrics & Gynecology

## 2021-01-04 LAB — WOUND CULTURE

## 2021-01-07 ENCOUNTER — Other Ambulatory Visit: Payer: Self-pay

## 2021-01-07 ENCOUNTER — Ambulatory Visit (INDEPENDENT_AMBULATORY_CARE_PROVIDER_SITE_OTHER): Payer: Medicaid Other | Admitting: Obstetrics & Gynecology

## 2021-01-07 VITALS — BP 130/73 | HR 105 | Wt 330.4 lb

## 2021-01-07 DIAGNOSIS — B3731 Acute candidiasis of vulva and vagina: Secondary | ICD-10-CM

## 2021-01-07 DIAGNOSIS — O0990 Supervision of high risk pregnancy, unspecified, unspecified trimester: Secondary | ICD-10-CM

## 2021-01-07 DIAGNOSIS — O10919 Unspecified pre-existing hypertension complicating pregnancy, unspecified trimester: Secondary | ICD-10-CM

## 2021-01-07 DIAGNOSIS — B373 Candidiasis of vulva and vagina: Secondary | ICD-10-CM

## 2021-01-07 DIAGNOSIS — O09899 Supervision of other high risk pregnancies, unspecified trimester: Secondary | ICD-10-CM

## 2021-01-07 DIAGNOSIS — K611 Rectal abscess: Secondary | ICD-10-CM

## 2021-01-07 DIAGNOSIS — O09299 Supervision of pregnancy with other poor reproductive or obstetric history, unspecified trimester: Secondary | ICD-10-CM

## 2021-01-07 MED ORDER — CEPHALEXIN 500 MG PO CAPS: 500.0000 mg | ORAL_CAPSULE | Freq: Three times a day (TID) | ORAL | 0 refills | Status: DC

## 2021-01-07 MED ORDER — METRONIDAZOLE 500 MG PO TABS: 500.0000 mg | ORAL_TABLET | Freq: Two times a day (BID) | ORAL | 0 refills | Status: DC

## 2021-01-07 MED ORDER — TERCONAZOLE 0.4 % VA CREA: TOPICAL_CREAM | VAGINAL | 0 refills | Status: DC

## 2021-01-07 NOTE — Progress Notes (Signed)
PRENATAL VISIT NOTE  Subjective:  Jill Mullen is a 35 y.o. [phone] at [redacted]w[redacted]d being seen today for ongoing prenatal care.  She is currently monitored for the following issues for this high-risk pregnancy and has Supervision of high risk pregnancy, antepartum; Chronic hypertension affecting pregnancy; History of preterm delivery, currently pregnant; BMI 45.0-49.9, adult (HCC); Hx of preeclampsia, prior pregnancy, currently pregnant; Nausea and vomiting during pregnancy; Anxiety and depression; and Spotting and cramping affecting pregnancy, antepartum on their problem list.  Patient reports vulvar abscess is a lot less tender and there really isn't much drainage.  has only had minimal spotting.  Denies fever.  Having some vulvar itching that is only external.  Started after third day of antibiotics.  Denies vaginal discharge. Contractions: Not present. Vag. Bleeding: None.  Movement: Present. Denies leaking of fluid.   The following portions of the patient's history were reviewed and updated as appropriate: allergies, current medications, past family history, past medical history, past social history, past surgical history and problem list.   Objective:   Vitals:   01/07/21 1334  BP: 130/73  Pulse: (!) 105  Weight: (!) 330 lb 6.4 oz (149.9 kg)    Fetal Status: Fetal Heart Rate (bpm): 147 Fundal Height: 28 cm Movement: Present     General:  Alert, oriented and cooperative. Patient is in no acute distress.  Skin: Skin is warm and dry. No rash noted.   Cardiovascular: Normal heart rate noted  Respiratory: Normal respiratory effort, no problems with respiration noted  Abdomen: Soft, gravid, appropriate for gestational age.  Pain/Pressure: Present     Pelvic: Lesion about 6cm from rectum out on buttocks that is without any drainage.  Two lesions still present.  Much less tender to palpation.  Induration about 3 x 3cm noted.  Extremities: Normal range of motion.  Edema: None  Mental  Status: Normal mood and affect. Normal behavior. Normal judgment and thought content.   Assessment and Plan:  Pregnancy: [id number] at [redacted]w[redacted]d 1. Supervision of high risk pregnancy, antepartum - on PNV and baby ASA  2. Chronic hypertension affecting pregnancy - Growth scan scheduled for 01/27/21.  Having monthly. - Weekly BPPs or biweekly NSTs starting at 32 weeks  3. History of preterm delivery, currently pregnant - not on Makena  4. Hx of preeclampsia, prior pregnancy, currently pregnant  5. Perirectal abscess - looks better.  I feel ok to watch another week and stay on antibiotics.  Will recheck on Monday.  If no better then today, will need I&D.  Pt and I discussed this today.  She states she will be prepared.   - metroNIDAZOLE (FLAGYL) 500 MG tablet; Take 1 tablet (500 mg total) by mouth 2 (two) times daily.  Dispense: 14 tablet; Refill: 0 - cephALEXin (KEFLEX) 500 MG capsule; Take 1 capsule (500 mg total) by mouth 3 (three) times daily.  Dispense: 21 capsule; Refill: 0  6. Vulvovaginitis due to yeast - terconazole (TERAZOL 7) 0.4 % vaginal cream; Apply externally twice daily while on antibiotics.  Dispense: 45 g; Refill: 0   Preterm labor symptoms and general obstetric precautions including but not limited to vaginal bleeding, contractions, leaking of fluid and fetal movement were reviewed in detail with the patient. Please refer to After Visit Summary for other counseling recommendations.   Return in about 5 days (around 01/12/2021).  Future Appointments  Date Time Provider Department Center  01/07/2021  2:15 PM Jerene Bears, MD [facility]  01/14/2021  3:35 PM Dutton Bing,  MD [facility]  01/27/2021 10:45 AM [facility] NURSE [facility]  01/27/2021 11:00 AM [facility] US1 [facility]  02/24/2021  9:15 AM [facility]    Jerene Bears, MD

## 2021-01-07 NOTE — Progress Notes (Signed)
Here to have abscess checked. States not hurting today. Feels like it has gone down. States not draining down. C/o vaginal itching Kieran Arreguin,RN

## 2021-01-12 ENCOUNTER — Encounter: Payer: Medicaid Other | Admitting: Obstetrics & Gynecology

## 2021-01-12 ENCOUNTER — Telehealth: Payer: Self-pay | Admitting: Obstetrics & Gynecology

## 2021-01-12 NOTE — Telephone Encounter (Signed)
Patient called to say she was not able to make her appointment today. She stated her car was not working. I offered her an appointment, but she only wanted to see Dr Hyacinth Meeker. I informed her Dr Hyacinth Meeker was not available, she refused to see another provider. She stated Dr. Hyacinth Meeker was working with her about something personal. I asked her did she not want to schedule with another provider, and she said no! Call was disconnected.

## 2021-01-14 ENCOUNTER — Encounter: Payer: Medicaid Other | Admitting: Obstetrics and Gynecology

## 2021-01-15 LAB — POCT URINALYSIS DIP (DEVICE)
Bilirubin Urine: NEGATIVE
Glucose, UA: NEGATIVE mg/dL
Hgb urine dipstick: NEGATIVE
Ketones, ur: NEGATIVE mg/dL
Leukocytes,Ua: NEGATIVE
Nitrite: NEGATIVE
Protein, ur: NEGATIVE mg/dL
Specific Gravity, Urine: 1.025 (ref 1.005–1.030)
Urobilinogen, UA: 0.2 mg/dL (ref 0.0–1.0)
pH: 7 (ref 5.0–8.0)

## 2021-01-19 ENCOUNTER — Other Ambulatory Visit: Payer: Self-pay | Admitting: Family Medicine

## 2021-01-27 ENCOUNTER — Other Ambulatory Visit: Payer: Self-pay | Admitting: *Deleted

## 2021-01-27 ENCOUNTER — Ambulatory Visit: Payer: Medicaid Other | Attending: Obstetrics

## 2021-01-27 ENCOUNTER — Ambulatory Visit: Payer: Medicaid Other | Admitting: *Deleted

## 2021-01-27 ENCOUNTER — Encounter: Payer: Self-pay | Admitting: *Deleted

## 2021-01-27 ENCOUNTER — Other Ambulatory Visit: Payer: Self-pay

## 2021-01-27 DIAGNOSIS — O321XX0 Maternal care for breech presentation, not applicable or unspecified: Secondary | ICD-10-CM

## 2021-01-27 DIAGNOSIS — O99213 Obesity complicating pregnancy, third trimester: Secondary | ICD-10-CM | POA: Diagnosis not present

## 2021-01-27 DIAGNOSIS — O09899 Supervision of other high risk pregnancies, unspecified trimester: Secondary | ICD-10-CM | POA: Diagnosis present

## 2021-01-27 DIAGNOSIS — Z362 Encounter for other antenatal screening follow-up: Secondary | ICD-10-CM | POA: Diagnosis not present

## 2021-01-27 DIAGNOSIS — Z3A28 28 weeks gestation of pregnancy: Secondary | ICD-10-CM

## 2021-01-27 DIAGNOSIS — O10013 Pre-existing essential hypertension complicating pregnancy, third trimester: Secondary | ICD-10-CM

## 2021-01-27 DIAGNOSIS — O0990 Supervision of high risk pregnancy, unspecified, unspecified trimester: Secondary | ICD-10-CM | POA: Insufficient documentation

## 2021-01-27 DIAGNOSIS — O09213 Supervision of pregnancy with history of pre-term labor, third trimester: Secondary | ICD-10-CM | POA: Diagnosis not present

## 2021-01-27 DIAGNOSIS — O10919 Unspecified pre-existing hypertension complicating pregnancy, unspecified trimester: Secondary | ICD-10-CM

## 2021-01-27 DIAGNOSIS — O09293 Supervision of pregnancy with other poor reproductive or obstetric history, third trimester: Secondary | ICD-10-CM

## 2021-01-28 ENCOUNTER — Encounter: Payer: Self-pay | Admitting: Family Medicine

## 2021-01-28 ENCOUNTER — Ambulatory Visit (INDEPENDENT_AMBULATORY_CARE_PROVIDER_SITE_OTHER): Payer: Medicaid Other | Admitting: Family Medicine

## 2021-01-28 VITALS — BP 136/73 | HR 99 | Wt 331.0 lb

## 2021-01-28 DIAGNOSIS — O0990 Supervision of high risk pregnancy, unspecified, unspecified trimester: Secondary | ICD-10-CM

## 2021-01-28 DIAGNOSIS — F32A Depression, unspecified: Secondary | ICD-10-CM

## 2021-01-28 DIAGNOSIS — O09299 Supervision of pregnancy with other poor reproductive or obstetric history, unspecified trimester: Secondary | ICD-10-CM

## 2021-01-28 DIAGNOSIS — F419 Anxiety disorder, unspecified: Secondary | ICD-10-CM

## 2021-01-28 DIAGNOSIS — O10919 Unspecified pre-existing hypertension complicating pregnancy, unspecified trimester: Secondary | ICD-10-CM

## 2021-01-28 DIAGNOSIS — O09899 Supervision of other high risk pregnancies, unspecified trimester: Secondary | ICD-10-CM

## 2021-01-28 DIAGNOSIS — Z6841 Body Mass Index (BMI) 40.0 and over, adult: Secondary | ICD-10-CM

## 2021-01-28 MED ORDER — NIFEDIPINE ER 30 MG PO TB24: 30.0000 mg | ORAL_TABLET | Freq: Every day | ORAL | 11 refills | Status: DC

## 2021-01-28 NOTE — Progress Notes (Signed)
   Subjective:  Jill Mullen is a 35 y.o. [id number] at 101w1d being seen today for ongoing prenatal care.  She is currently monitored for the following issues for this high-risk pregnancy and has Supervision of high risk pregnancy, antepartum; Chronic hypertension affecting pregnancy; History of preterm delivery, currently pregnant; BMI 45.0-49.9, adult (HCC); Hx of preeclampsia, prior pregnancy, currently pregnant; Nausea and vomiting during pregnancy; Anxiety and depression; and Spotting and cramping affecting pregnancy, antepartum on their problem list.  Patient reports some drainage and discomfort from her abscess.  Contractions: Not present. Vag. Bleeding: None.  Movement: Present. Denies leaking of fluid.   The following portions of the patient's history were reviewed and updated as appropriate: allergies, current medications, past family history, past medical history, past social history, past surgical history and problem list. Problem list updated.  Objective:   Vitals:   01/28/21 1631  BP: 136/73  Pulse: 99  Weight: (!) 331 lb (150.1 kg)    Fetal Status: Fetal Heart Rate (bpm): 148   Movement: Present     General:  Alert, oriented and cooperative. Patient is in no acute distress.  Skin: Skin is warm and dry. No rash noted.   Cardiovascular: Normal heart rate noted  Respiratory: Normal respiratory effort, no problems with respiration noted  Abdomen: Soft, gravid, appropriate for gestational age. Pain/Pressure: Present     Pelvic: Vag. Bleeding: None     Cervical exam deferred        Extremities: Normal range of motion.  Edema: None  Mental Status: Normal mood and affect. Normal behavior. Normal judgment and thought content.   Urinalysis:      Assessment and Plan:  Pregnancy: [id number] at [redacted]w[redacted]d  1. Supervision of high risk pregnancy, antepartum BP at goal and FHR normal Discussed post placental IUD at length, patient will consider. Strongly recommended hormonal IUD given  BMI, irregular periods After visit noted patient did not receive TDaP or get 28 wk labs, will call her to reschedule for this  2. Chronic hypertension affecting pregnancy Stable on Nifedipine, refill sent  3. BMI 45.0-49.9, adult (HCC)   4. History of preterm delivery, currently pregnant [redacted]wks, two term deliveries  5. Hx of preeclampsia, prior pregnancy, currently pregnant On baby asa  6. Anxiety and depression   7. Perineal abscess Location somewhat distant from anus but not a Bartholin's. Patient reports she has completed her course of antibiotics and is feeling some improvement. Actively draining pus on exam, declines I&D. Recommended no further antibiotics, try to do sitz baths BID to TID as she is able to  Preterm labor symptoms and general obstetric precautions including but not limited to vaginal bleeding, contractions, leaking of fluid and fetal movement were reviewed in detail with the patient. Please refer to After Visit Summary for other counseling recommendations.  Return in 2 weeks (on 02/11/2021) for [facility], ob visit.   Venora Maples, MD

## 2021-01-28 NOTE — Patient Instructions (Signed)
 Contraception Choices Contraception, also called birth control, refers to methods or devices that prevent pregnancy. Hormonal methods Contraceptive implant A contraceptive implant is a thin, plastic tube that contains a hormone that prevents pregnancy. It is different from an intrauterine device (IUD). It is inserted into the upper part of the arm by a health care provider. Implants can be effective for up to 3 years. Progestin-only injections Progestin-only injections are injections of progestin, a synthetic form of the hormone progesterone. They are given every 3 months by a health care provider. Birth control pills Birth control pills are pills that contain hormones that prevent pregnancy. They must be taken once a day, preferably at the same time each day. A prescription is needed to use this method of contraception. Birth control patch The birth control patch contains hormones that prevent pregnancy. It is placed on the skin and must be changed once a week for three weeks and removed on the fourth week. A prescription is needed to use this method of contraception. Vaginal ring A vaginal ring contains hormones that prevent pregnancy. It is placed in the vagina for three weeks and removed on the fourth week. After that, the process is repeated with a new ring. A prescription is needed to use this method of contraception. Emergency contraceptive Emergency contraceptives prevent pregnancy after unprotected sex. They come in pill form and can be taken up to 5 days after sex. They work best the sooner they are taken after having sex. Most emergency contraceptives are available without a prescription. This method should not be used as your only form of birth control.   Barrier methods Female condom A female condom is a thin sheath that is worn over the penis during sex. Condoms keep sperm from going inside a woman's body. They can be used with a sperm-killing substance (spermicide) to increase their  effectiveness. They should be thrown away after one use. Female condom A female condom is a soft, loose-fitting sheath that is put into the vagina before sex. The condom keeps sperm from going inside a woman's body. They should be thrown away after one use. Diaphragm A diaphragm is a soft, dome-shaped barrier. It is inserted into the vagina before sex, along with a spermicide. The diaphragm blocks sperm from entering the uterus, and the spermicide kills sperm. A diaphragm should be left in the vagina for 6-8 hours after sex and removed within 24 hours. A diaphragm is prescribed and fitted by a health care provider. A diaphragm should be replaced every 1-2 years, after giving birth, after gaining more than 15 lb (6.8 kg), and after pelvic surgery. Cervical cap A cervical cap is a round, soft latex or plastic cup that fits over the cervix. It is inserted into the vagina before sex, along with spermicide. It blocks sperm from entering the uterus. The cap should be left in place for 6-8 hours after sex and removed within 48 hours. A cervical cap must be prescribed and fitted by a health care provider. It should be replaced every 2 years. Sponge A sponge is a soft, circular piece of polyurethane foam with spermicide in it. The sponge helps block sperm from entering the uterus, and the spermicide kills sperm. To use it, you make it wet and then insert it into the vagina. It should be inserted before sex, left in for at least 6 hours after sex, and removed and thrown away within 30 hours. Spermicides Spermicides are chemicals that kill or block sperm from entering the   cervix and uterus. They can come as a cream, jelly, suppository, foam, or tablet. A spermicide should be inserted into the vagina with an applicator at least 10-15 minutes before sex to allow time for it to work. The process must be repeated every time you have sex. Spermicides do not require a prescription.   Intrauterine  contraception Intrauterine device (IUD) An IUD is a T-shaped device that is put in a woman's uterus. There are two types:  Hormone IUD.This type contains progestin, a synthetic form of the hormone progesterone. This type can stay in place for 3-5 years.  Copper IUD.This type is wrapped in copper wire. It can stay in place for 10 years. Permanent methods of contraception Female tubal ligation In this method, a woman's fallopian tubes are sealed, tied, or blocked during surgery to prevent eggs from traveling to the uterus. Hysteroscopic sterilization In this method, a small, flexible insert is placed into each fallopian tube. The inserts cause scar tissue to form in the fallopian tubes and block them, so sperm cannot reach an egg. The procedure takes about 3 months to be effective. Another form of birth control must be used during those 3 months. Female sterilization This is a procedure to tie off the tubes that carry sperm (vasectomy). After the procedure, the man can still ejaculate fluid (semen). Another form of birth control must be used for 3 months after the procedure. Natural planning methods Natural family planning In this method, a couple does not have sex on days when the woman could become pregnant. Calendar method In this method, the woman keeps track of the length of each menstrual cycle, identifies the days when pregnancy can happen, and does not have sex on those days. Ovulation method In this method, a couple avoids sex during ovulation. Symptothermal method This method involves not having sex during ovulation. The woman typically checks for ovulation by watching changes in her temperature and in the consistency of cervical mucus. Post-ovulation method In this method, a couple waits to have sex until after ovulation. Where to find more information  Centers for Disease Control and Prevention: www.cdc.gov Summary  Contraception, also called birth control, refers to methods or  devices that prevent pregnancy.  Hormonal methods of contraception include implants, injections, pills, patches, vaginal rings, and emergency contraceptives.  Barrier methods of contraception can include female condoms, female condoms, diaphragms, cervical caps, sponges, and spermicides.  There are two types of IUDs (intrauterine devices). An IUD can be put in a woman's uterus to prevent pregnancy for 3-5 years.  Permanent sterilization can be done through a procedure for males and females. Natural family planning methods involve nothaving sex on days when the woman could become pregnant. This information is not intended to replace advice given to you by your health care provider. Make sure you discuss any questions you have with your health care provider. Document Revised: 04/28/2020 Document Reviewed: 04/28/2020 Elsevier Patient Education  2021 [employer]   Breastfeeding  Choosing to breastfeed is one of the best decisions you can make for yourself and your baby. A change in hormones during pregnancy causes your breasts to make breast milk in your milk-producing glands. Hormones prevent breast milk from being released before your baby is born. They also prompt milk flow after birth. Once breastfeeding has begun, thoughts of your baby, as well as his or her sucking or crying, can stimulate the release of milk from your milk-producing glands. Benefits of breastfeeding Research shows that breastfeeding offers many health benefits   for infants and mothers. It also offers a cost-free and convenient way to feed your baby. For your baby  Your first milk (colostrum) helps your baby's digestive system to function better.  Special cells in your milk (antibodies) help your baby to fight off infections.  Breastfed babies are less likely to develop asthma, allergies, obesity, or type 2 diabetes. They are also at lower risk for sudden infant death syndrome (SIDS).  Nutrients in breast milk are better  able to meet your baby's needs compared to infant formula.  Breast milk improves your baby's brain development. For you  Breastfeeding helps to create a very special bond between you and your baby.  Breastfeeding is convenient. Breast milk costs nothing and is always available at the correct temperature.  Breastfeeding helps to burn calories. It helps you to lose the weight that you gained during pregnancy.  Breastfeeding makes your uterus return faster to its size before pregnancy. It also slows bleeding (lochia) after you give birth.  Breastfeeding helps to lower your risk of developing type 2 diabetes, osteoporosis, rheumatoid arthritis, cardiovascular disease, and breast, ovarian, uterine, and endometrial cancer later in life. Breastfeeding basics Starting breastfeeding  Find a comfortable place to sit or lie down, with your neck and back well-supported.  Place a pillow or a rolled-up blanket under your baby to bring him or her to the level of your breast (if you are seated). Nursing pillows are specially designed to help support your arms and your baby while you breastfeed.  Make sure that your baby's tummy (abdomen) is facing your abdomen.  Gently massage your breast. With your fingertips, massage from the outer edges of your breast inward toward the nipple. This encourages milk flow. If your milk flows slowly, you may need to continue this action during the feeding.  Support your breast with 4 fingers underneath and your thumb above your nipple (make the letter "C" with your hand). Make sure your fingers are well away from your nipple and your baby's mouth.  Stroke your baby's lips gently with your finger or nipple.  When your baby's mouth is open wide enough, quickly bring your baby to your breast, placing your entire nipple and as much of the areola as possible into your baby's mouth. The areola is the colored area around your nipple. ? More areola should be visible above your  baby's upper lip than below the lower lip. ? Your baby's lips should be opened and extended outward (flanged) to ensure an adequate, comfortable latch. ? Your baby's tongue should be between his or her lower gum and your breast.  Make sure that your baby's mouth is correctly positioned around your nipple (latched). Your baby's lips should create a seal on your breast and be turned out (everted).  It is common for your baby to suck about 2-3 minutes in order to start the flow of breast milk. Latching Teaching your baby how to latch onto your breast properly is very important. An improper latch can cause nipple pain, decreased milk supply, and poor weight gain in your baby. Also, if your baby is not latched onto your nipple properly, he or she may swallow some air during feeding. This can make your baby fussy. Burping your baby when you switch breasts during the feeding can help to get rid of the air. However, teaching your baby to latch on properly is still the best way to prevent fussiness from swallowing air while breastfeeding. Signs that your baby has successfully latched onto   your nipple  Silent tugging or silent sucking, without causing you pain. Infant's lips should be extended outward (flanged).  Swallowing heard between every 3-4 sucks once your milk has started to flow (after your let-down milk reflex occurs).  Muscle movement above and in front of his or her ears while sucking. Signs that your baby has not successfully latched onto your nipple  Sucking sounds or smacking sounds from your baby while breastfeeding.  Nipple pain. If you think your baby has not latched on correctly, slip your finger into the corner of your baby's mouth to break the suction and place it between your baby's gums. Attempt to start breastfeeding again. Signs of successful breastfeeding Signs from your baby  Your baby will gradually decrease the number of sucks or will completely stop sucking.  Your baby  will fall asleep.  Your baby's body will relax.  Your baby will retain a small amount of milk in his or her mouth.  Your baby will let go of your breast by himself or herself. Signs from you  Breasts that have increased in firmness, weight, and size 1-3 hours after feeding.  Breasts that are softer immediately after breastfeeding.  Increased milk volume, as well as a change in milk consistency and color by the fifth day of breastfeeding.  Nipples that are not sore, cracked, or bleeding. Signs that your baby is getting enough milk  Wetting at least 1-2 diapers during the first 24 hours after birth.  Wetting at least 5-6 diapers every 24 hours for the first week after birth. The urine should be clear or pale yellow by the age of 5 days.  Wetting 6-8 diapers every 24 hours as your baby continues to grow and develop.  At least 3 stools in a 24-hour period by the age of 5 days. The stool should be soft and yellow.  At least 3 stools in a 24-hour period by the age of 7 days. The stool should be seedy and yellow.  No loss of weight greater than 10% of birth weight during the first 3 days of life.  Average weight gain of 4-7 oz (113-198 g) per week after the age of 4 days.  Consistent daily weight gain by the age of 5 days, without weight loss after the age of 2 weeks. After a feeding, your baby may spit up a small amount of milk. This is normal. Breastfeeding frequency and duration Frequent feeding will help you make more milk and can prevent sore nipples and extremely full breasts (breast engorgement). Breastfeed when you feel the need to reduce the fullness of your breasts or when your baby shows signs of hunger. This is called "breastfeeding on demand." Signs that your baby is hungry include:  Increased alertness, activity, or restlessness.  Movement of the head from side to side.  Opening of the mouth when the corner of the mouth or cheek is stroked (rooting).  Increased  sucking sounds, smacking lips, cooing, sighing, or squeaking.  Hand-to-mouth movements and sucking on fingers or hands.  Fussing or crying. Avoid introducing a pacifier to your baby in the first 4-6 weeks after your baby is born. After this time, you may choose to use a pacifier. Research has shown that pacifier use during the first year of a baby's life decreases the risk of sudden infant death syndrome (SIDS). Allow your baby to feed on each breast as long as he or she wants. When your baby unlatches or falls asleep while feeding from the   first breast, offer the second breast. Because newborns are often sleepy in the first few weeks of life, you may need to awaken your baby to get him or her to feed. Breastfeeding times will vary from baby to baby. However, the following rules can serve as a guide to help you make sure that your baby is properly fed:  Newborns (babies 4 weeks of age or younger) may breastfeed every 1-3 hours.  Newborns should not go without breastfeeding for longer than 3 hours during the day or 5 hours during the night.  You should breastfeed your baby a minimum of 8 times in a 24-hour period. Breast milk pumping Pumping and storing breast milk allows you to make sure that your baby is exclusively fed your breast milk, even at times when you are unable to breastfeed. This is especially important if you go back to work while you are still breastfeeding, or if you are not able to be present during feedings. Your lactation consultant can help you find a method of pumping that works best for you and give you guidelines about how long it is safe to store breast milk.      Caring for your breasts while you breastfeed Nipples can become dry, cracked, and sore while breastfeeding. The following recommendations can help keep your breasts moisturized and healthy:  Avoid using soap on your nipples.  Wear a supportive bra designed especially for nursing. Avoid wearing underwire-style  bras or extremely tight bras (sports bras).  Air-dry your nipples for 3-4 minutes after each feeding.  Use only cotton bra pads to absorb leaked breast milk. Leaking of breast milk between feedings is normal.  Use lanolin on your nipples after breastfeeding. Lanolin helps to maintain your skin's normal moisture barrier. Pure lanolin is not harmful (not toxic) to your baby. You may also hand express a few drops of breast milk and gently massage that milk into your nipples and allow the milk to air-dry. In the first few weeks after giving birth, some women experience breast engorgement. Engorgement can make your breasts feel heavy, warm, and tender to the touch. Engorgement peaks within 3-5 days after you give birth. The following recommendations can help to ease engorgement:  Completely empty your breasts while breastfeeding or pumping. You may want to start by applying warm, moist heat (in the shower or with warm, water-soaked hand towels) just before feeding or pumping. This increases circulation and helps the milk flow. If your baby does not completely empty your breasts while breastfeeding, pump any extra milk after he or she is finished.  Apply ice packs to your breasts immediately after breastfeeding or pumping, unless this is too uncomfortable for you. To do this: ? Put ice in a plastic bag. ? Place a towel between your skin and the bag. ? Leave the ice on for 20 minutes, 2-3 times a day.  Make sure that your baby is latched on and positioned properly while breastfeeding. If engorgement persists after 48 hours of following these recommendations, contact your health care provider or a lactation consultant. Overall health care recommendations while breastfeeding  Eat 3 healthy meals and 3 snacks every day. Well-nourished mothers who are breastfeeding need an additional 450-500 calories a day. You can meet this requirement by increasing the amount of a balanced diet that you eat.  Drink  enough water to keep your urine pale yellow or clear.  Rest often, relax, and continue to take your prenatal vitamins to prevent fatigue, stress, and low   vitamin and mineral levels in your body (nutrient deficiencies).  Do not use any products that contain nicotine or tobacco, such as cigarettes and e-cigarettes. Your baby may be harmed by chemicals from cigarettes that pass into breast milk and exposure to secondhand smoke. If you need help quitting, ask your health care provider.  Avoid alcohol.  Do not use illegal drugs or marijuana.  Talk with your health care provider before taking any medicines. These include over-the-counter and prescription medicines as well as vitamins and herbal supplements. Some medicines that may be harmful to your baby can pass through breast milk.  It is possible to become pregnant while breastfeeding. If birth control is desired, ask your health care provider about options that will be safe while breastfeeding your baby. Where to find more information: [employer]: www.llli.org Contact a health care provider if:  You feel like you want to stop breastfeeding or have become frustrated with breastfeeding.  Your nipples are cracked or bleeding.  Your breasts are red, tender, or warm.  You have: ? Painful breasts or nipples. ? A swollen area on either breast. ? A fever or chills. ? Nausea or vomiting. ? Drainage other than breast milk from your nipples.  Your breasts do not become full before feedings by the fifth day after you give birth.  You feel sad and depressed.  Your baby is: ? Too sleepy to eat well. ? Having trouble sleeping. ? More than 1 week old and wetting fewer than 6 diapers in a 24-hour period. ? Not gaining weight by 5 days of age.  Your baby has fewer than 3 stools in a 24-hour period.  Your baby's skin or the white parts of his or her eyes become yellow. Get help right away if:  Your baby is overly tired  (lethargic) and does not want to wake up and feed.  Your baby develops an unexplained fever. Summary  Breastfeeding offers many health benefits for infant and mothers.  Try to breastfeed your infant when he or she shows early signs of hunger.  Gently tickle or stroke your baby's lips with your finger or nipple to allow the baby to open his or her mouth. Bring the baby to your breast. Make sure that much of the areola is in your baby's mouth. Offer one side and burp the baby before you offer the other side.  Talk with your health care provider or lactation consultant if you have questions or you face problems as you breastfeed. This information is not intended to replace advice given to you by your health care provider. Make sure you discuss any questions you have with your health care provider. Document Revised: 02/16/2018 Document Reviewed: 12/24/2016 Elsevier Patient Education  2021 [employer] Inc.  

## 2021-01-29 ENCOUNTER — Telehealth: Payer: Self-pay

## 2021-01-29 DIAGNOSIS — O0990 Supervision of high risk pregnancy, unspecified, unspecified trimester: Secondary | ICD-10-CM

## 2021-01-29 NOTE — Telephone Encounter (Deleted)
-----   Message from Venora Maples, MD sent at 01/28/2021  9:37 PM EST ----- Regarding: we are dum dums But we are dum dums with hearts of gold.  If I'm not mistaken, we have failed to do 28 wk labs..or a TDaP...sigh. But at least I never touched that abscess and refilled her Nifedipine! And the laughs. Oh what great laughs we had.   Mind calling and setting her up for those? I've put the orders in.  Your favorite doctor,  Tinnie Gens

## 2021-01-29 NOTE — Telephone Encounter (Signed)
Called pt to schedule 28 week lab appointment. Pt states she does not want to be "poked." Explained importance of screening for HIV and RPR during the third trimester. Also explained that it is very important that pt is screened for gestational diabetes as this can be very dangerous for her and baby if left untreated. Pt still declines. I explained that another option would be for patient to check blood glucose by finger stick 4 times/day, pt also declines this option. Will follow up at future appointments.

## 2021-02-12 ENCOUNTER — Ambulatory Visit (INDEPENDENT_AMBULATORY_CARE_PROVIDER_SITE_OTHER): Payer: Medicaid Other | Admitting: Obstetrics & Gynecology

## 2021-02-12 ENCOUNTER — Other Ambulatory Visit: Payer: Self-pay

## 2021-02-12 VITALS — BP 135/84 | HR 93 | Wt 335.6 lb

## 2021-02-12 DIAGNOSIS — F40298 Other specified phobia: Secondary | ICD-10-CM

## 2021-02-12 DIAGNOSIS — O09299 Supervision of pregnancy with other poor reproductive or obstetric history, unspecified trimester: Secondary | ICD-10-CM

## 2021-02-12 DIAGNOSIS — O0990 Supervision of high risk pregnancy, unspecified, unspecified trimester: Secondary | ICD-10-CM

## 2021-02-12 DIAGNOSIS — Z6841 Body Mass Index (BMI) 40.0 and over, adult: Secondary | ICD-10-CM

## 2021-02-12 DIAGNOSIS — O10919 Unspecified pre-existing hypertension complicating pregnancy, unspecified trimester: Secondary | ICD-10-CM

## 2021-02-12 NOTE — Progress Notes (Signed)
   PRENATAL VISIT NOTE  Subjective:  Jill Mullen is a 35 y.o. [phone] at [redacted]w[redacted]d being seen today for ongoing prenatal care.  She is currently monitored for the following issues for this high-risk pregnancy and has Supervision of high risk pregnancy, antepartum; Chronic hypertension affecting pregnancy; History of preterm delivery, currently pregnant; BMI 45.0-49.9, adult (HCC); Hx of preeclampsia, prior pregnancy, currently pregnant; Nausea and vomiting during pregnancy; Anxiety and depression; and Spotting and cramping affecting pregnancy, antepartum on their problem list.  Patient reports no complaints.  Contractions: Not present. Vag. Bleeding: None.  Movement: Present. Denies leaking of fluid.   The following portions of the patient's history were reviewed and updated as appropriate: allergies, current medications, past family history, past medical history, past social history, past surgical history and problem list.   Objective:   Vitals:   02/12/21 1626  BP: 135/84  Pulse: 93  Weight: (!) 335 lb 9.6 oz (152.2 kg)    Fetal Status: Fetal Heart Rate (bpm): 135   Movement: Present     General:  Alert, oriented and cooperative. Patient is in no acute distress.  Skin: Skin is warm and dry. No rash noted.   Cardiovascular: Normal heart rate noted  Respiratory: Normal respiratory effort, no problems with respiration noted  Abdomen: Soft, gravid, appropriate for gestational age.  Pain/Pressure: Present     Pelvic: Cervical exam deferred        Extremities: Normal range of motion.  Edema: None  Mental Status: Normal mood and affect. Normal behavior. Normal judgment and thought content.   Assessment and Plan:  Pregnancy: [id number] at [redacted]w[redacted]d 1. Chronic hypertension affecting pregnancy On Procardia  2. Hx of preeclampsia, prior pregnancy, currently pregnant No sign of preeclampsia  3. BMI 45.0-49.9, adult (HCC) Needs to have 2 hr GTT  4. Supervision of high risk pregnancy,  antepartum Needle phobia  Preterm labor symptoms and general obstetric precautions including but not limited to vaginal bleeding, contractions, leaking of fluid and fetal movement were reviewed in detail with the patient. Please refer to After Visit Summary for other counseling recommendations.   Return in about 2 weeks (around 02/26/2021).  Future Appointments  Date Time Provider Department Center  02/24/2021  9:15 AM [facility]IC[facility]  02/26/2021 10:45 AM [facility] NURSE [facility]  02/26/2021 11:00 AM [facility] US1 [facility]  03/03/2021 10:45 AM [facility] NURSE [facility]  03/03/2021 11:15 AM [facility] US2 [facility]  03/26/2021  8:00 AM Cozart, Neena Rhymes, LCSW [facility] None  03/31/2021 11:00 AM Shanna Cisco, NP [facility] None    Scheryl Darter, MD

## 2021-02-12 NOTE — Patient Instructions (Signed)
Gestational Diabetes Mellitus, Diagnosis Gestational diabetes mellitus is a form of diabetes. It can happen when you are pregnant. The diabetes goes away after you give birth. If you do not get treated for this condition, it may cause problems for you or your baby. What are the causes? This condition is caused by changes in your body when you are pregnant. When these happen:  A part of the body called the pancreas does not make enough insulin.  The body cannot use insulin in the right way. Sugars cannot get into cells in your body. The sugars stay in your blood. This leads to high blood sugar.   What increases the risk?  Being older than age 25 when pregnant.  Having someone with diabetes in your family.  Too much body weight.  Having had this condition in the past.  Polycystic ovary syndrome.  Being pregnant with more than one baby. What are the signs or symptoms?  Being thirsty often.  Being hungry often.  Needing to pee more often. How is this treated?  Eat a healthy diet.  Get more exercise.  Check your blood sugar often.  Take insulin and other medicines, if needed.  Work with an expert on this condition, if told. Follow these instructions at home: Learn about your diabetes Ask your doctor:  How often should I check my blood sugar? Where do I get the equipment?  What medicines do I need? When should I take them?  Do I need to meet with an educator?  Who can I call if I have questions?  Where can I find a support group? General instructions  Take medicines only as told by your doctor.  Stay at a healthy weight.  Drink enough fluid to keep your pee pale yellow.  Wear an alert bracelet or carry a card that shows you have this condition.  Keep all follow-up visits. Where to find more information  American Diabetes Association (ADA): diabetes.org  Association of Diabetes Care & Education Specialists (ADCES): diabeteseducator.org  Centers for  Disease Control and Prevention (CDC): cdc.gov  American Pregnancy Association: americanpregnancy.org  [place] Department of Agriculture MyPlate: myplate.gov Contact a doctor if:  Your blood sugar is at or above 240 mg/dL (13.3 mmol/L).  Your blood sugar is at or above 200 mg/dL (11.1 mmol/L) and you have ketones in your pee.  You have a fever.  You are sick for 2 days or more and you do not get better.  You have either of these problems for more than 6 hours: ? You vomit every time you eat or drink. ? You have watery poop (diarrhea). Get help right away if:  You cannot think clearly.  You are not breathing well.  You have a lot of ketones in your pee.  Your baby seems to move less than normal.  Abnormal fluid or blood starts to come out of your vagina.  You start having contractions before your due date. You may feel your belly tighten.  You have a very bad headache. These symptoms may be an emergency. Get help right away. Call your local emergency services (911 in the [place]  Do not wait to see if the symptoms will go away.  Do not drive yourself to the hospital. Summary  Gestational diabetes is a form of diabetes. It can happen when you are pregnant.  This condition occurs when your body cannot make or use insulin in the right way.  Eat a healthy diet, exercise, and use medicines or insulin   as told by your doctor.  Tell your doctor if your blood sugar is high, you have a fever, or you vomit every time you eat or drink.  Get help right away if you cannot think clearly, you are not breathing well, or your baby seems to move less than normal. This information is not intended to replace advice given to you by your health care provider. Make sure you discuss any questions you have with your health care provider. Document Revised: 04/28/2020 Document Reviewed: 04/28/2020 Elsevier Patient Education  2021 Elsevier Inc.  

## 2021-02-16 NOTE — BH Specialist Note (Signed)
Integrated Behavioral Health via Telemedicine Visit  02/16/2021 Jill Mullen [id number]  Number of Integrated Behavioral Health visits: 5 Session Start time: 9:19  Session End time: 9:36 Total time: 17  Referring Provider: Merian Capron, MD Patient/Family location: [facility] Provider [facility]: [facility]  All persons participating in visit: Patient Jill Mullen and [facility] Jill Mullen   Types of Service: Individual psychotherapy and Video visit  I connected with Jill Mullen and/or Jill Mullen's n/a via  Telephone or Video Enabled Telemedicine Application  (Video is Caregility application) and verified that I am speaking with the correct person using two identifiers. Discussed confidentiality: Yes   I discussed the limitations of telemedicine and the availability of in person appointments.  Discussed there is a possibility of technology failure and discussed alternative modes of communication if that failure occurs.  I discussed that engaging in this telemedicine visit, they consent to the provision of behavioral healthcare and the services will be billed under their insurance.  Patient and/or legal guardian expressed understanding and consented to Telemedicine visit: Yes   Presenting Concerns: Patient and/or family reports the following symptoms/concerns: Pt states her primary focus is preparing for baby's arrival, is taking Zoloft as prescribed, has initial appointments set up for psychiatry and ongoing therapy, finishing school semester,sleep remains improved, family is supportive.  Duration of problem: Current pregnancy; Severity of problem: mild  Patient and/or Family's Strengths/Protective Factors: Social connections, Social and Emotional competence, Concrete supports in place (healthy food, safe environments, etc.) and Sense of purpose  Goals Addressed: Patient will: 1.  Maintain reduction of  symptoms of: anxiety, depression and stress  2.  Demonstrate ability to: Increase healthy adjustment to current life circumstances  Progress towards Goals: Ongoing  Interventions: Interventions utilized:  Supportive Counseling Standardized Assessments completed: Not Needed  Patient and/or Family Response: Pt agrees with treatment plan  Assessment: Patient currently experiencing Adjustment disorder with mixed anxiety and depression and Psychosocial stress.   Patient may benefit from supportive counseling today.  Plan: 1. Follow up with behavioral health clinician on : Call Jill Mullen at [phone] as needed 2. Behavioral recommendations:  -Continue taking Zoloft 50mg  and prenatal vitamin as prescribed -Continue with plan to attend initial psychiatry and ongoing therapy appointments on 03/26/21 and 03/31/21 -Continue using healthy self-coping strategies to manage emotions -Consider reading through Postpartum Planner (on After visit summary); use as needed 3. Referral(s): Integrated 04/02/21 (In Clinic)  I discussed the assessment and treatment plan with the patient and/or parent/guardian. They were provided an opportunity to ask questions and all were answered. They agreed with the plan and demonstrated an understanding of the instructions.   They were advised to call back or seek an in-person evaluation if the symptoms worsen or if the condition fails to improve as anticipated.  [employer] Jill Lymon, LCSW

## 2021-02-23 ENCOUNTER — Other Ambulatory Visit: Payer: Self-pay

## 2021-02-23 DIAGNOSIS — O0990 Supervision of high risk pregnancy, unspecified, unspecified trimester: Secondary | ICD-10-CM

## 2021-02-24 ENCOUNTER — Ambulatory Visit (INDEPENDENT_AMBULATORY_CARE_PROVIDER_SITE_OTHER): Payer: Medicaid Other | Admitting: Clinical

## 2021-02-24 DIAGNOSIS — Z658 Other specified problems related to psychosocial circumstances: Secondary | ICD-10-CM

## 2021-02-24 DIAGNOSIS — F4323 Adjustment disorder with mixed anxiety and depressed mood: Secondary | ICD-10-CM | POA: Diagnosis not present

## 2021-02-24 NOTE — Patient Instructions (Signed)
[facility] [place], [place] [address] [phone] (main office) [phone] ([place] office)     BRAINSTORMING  Develop a Plan Goals: . Provide a way to start conversation about your new life with a baby . Assist parents in recognizing and using resources within their reach . Help pave the way before birth for an easier period of transition afterwards.  Make a list of the following information to keep in a central location: . Full name of Mom and Partner: _____________________________________________ . 81 full name and Date of Birth: ___________________________________________ . Home Address: ___________________________________________________________ ________________________________________________________________________ . Home Phone: ____________________________________________________________ . Parents' cell numbers: _____________________________________________________ ________________________________________________________________________ . Name and contact info for OB: ______________________________________________ . Name and contact info for Pediatrician:________________________________________ . Contact info for Lactation Consultants: ________________________________________  REST and SLEEP *You each need at least 4-5 hours of uninterrupted sleep every day. Write specific names and contact information.* . How are you going to rest in the postpartum period? While partner's home? When partner returns to work? When you both return to work? Marland Kitchen Where will your baby sleep? Marland Kitchen Who is available to help during the day? Evening? Night? . Who could move in for a period to help support you? Marland Kitchen What are some ideas to help you get enough  sleep? __________________________________________________________________________________________________________________________________________________________________________________________________________________________________________ NUTRITIOUS FOOD AND DRINK *Plan for meals before your baby is born so you can have healthy food to eat during the immediate postpartum period.* . Who will look after breakfast? Lunch? Dinner? List names and contact information. Brainstorm quick, healthy ideas for each meal. . What can you do before baby is born to prepare meals for the postpartum period? . How can others help you with meals? Marland Kitchen Which grocery stores provide online shopping and delivery? Marland Kitchen Which restaurants offer take-out or delivery options? ______________________________________________________________________________________________________________________________________________________________________________________________________________________________________________________________________________________________________________________________________________________________________________________________________  CARE FOR MOM *It's important that mom is cared for and pampered in the postpartum period. Remember, the most important ways new mothers need care are: sleep, nutrition, gentle exercise, and time off.* . Who can come take care of mom during this period? Make a list of people with their contact information. . List some activities that make you feel cared for, rested, and energized? Who can make sure you have opportunities to do these things? . Does mom have a space of her very own within your home that's just for her? Make a "[facility]" where she can be comfortable, rest, and renew herself  daily. ______________________________________________________________________________________________________________________________________________________________________________________________________________________________________________________________________________________________________________________________________________________________________________________________________    CARE FOR AND FEEDING BABY *Knowledgeable and encouraging people will offer the best support with regard to feeding your baby.* . Educate yourself and choose the best feeding option for your baby. . Make a list of people who will guide, support, and be a resource for you as your care for and feed your baby. (Friends that have breastfed or are currently breastfeeding, lactation consultants, breastfeeding support groups, etc.) . Consider a postpartum doula. (These websites can give you information: dona.org & BuyingShow.es) . Seek out local breastfeeding resources like the breastfeeding support group at [employer] or [employer]. ______________________________________________________________________________________________________________________________________________________________________________________________________________________________________________________________________________________________________________________________________________________________________________________________________  Verner Chol AND ERRANDS . Who can help with a thorough cleaning before baby is born? . Make a list of people who will help with housekeeping and chores, like laundry, light cleaning, dishes, bathrooms, etc. . Who can run some errands for you? Marland Kitchen What can you do to make sure you are stocked with basic supplies before baby is born? . Who is going to do the  shopping? ______________________________________________________________________________________________________________________________________________________________________________________________________________________________________________________________________________________________________________________________________________________________________________________________________     Family Adjustment *Nurture yourselves.it helps parents be more loving and allows for better bonding with their child.* . What sorts of things do you and partner enjoy doing together? Which activities help you to connect and strengthen your relationship? Make a list of those things. Make a list of people whom you trust to care for your baby so you can have some time together as a couple. . What types of things help partner feel connected to Mom? Make a list. . What needs will partner have in order to bond with baby? . Other children? Who will care for them when you go into labor and while you are in the hospital? . Think about what the needs of your older children might be. Who can help you meet those needs? In what ways are you helping them prepare for bringing baby home? List some specific strategies you have for family adjustment. _______________________________________________________________________________________________________________________________________________________________________________________________________________________________________________________________________________________________________________________________________________  SUPPORT *Someone who can empathize with experiences normalizes your problems and makes them more bearable.* . Make a list of other friends, neighbors, and/or co-workers you know with infants (and small children, if applicable) with whom you can connect. . Make a list of local or online support groups, mom groups, etc. in which you can be  involved. ______________________________________________________________________________________________________________________________________________________________________________________________________________________________________________________________________________________________________________________________________________________________________________________________________  Childcare Plans . Investigate and plan for childcare if mom is returning to work. . Talk about mom's concerns about her transition back to work. . Talk about partner's concerns regarding this transition.  Mental Health *Your mental health is one of the highest priorities for a pregnant or postpartum mom.* . 1 in 5 women experience anxiety and/or depression from the time of conception through the first year after birth. . Postpartum Mood Disorders are the #1 complication of pregnancy and childbirth and the suffering experienced by these mothers is not necessary! These illnesses are temporary and respond well to treatment, which often includes self-care, social support, talk therapy, and medication when needed. . Women experiencing anxiety and depression often say things like: "I'm supposed to be happy.why do I feel so sad?", "Why can't I snap out of it?", "I'm having thoughts that scare me." . There is no need to be embarrassed if you are feeling these symptoms: o Overwhelmed, anxious, angry, sad, guilty, irritable, hopeless, exhausted but can't sleep o You are NOT alone. You are NOT to blame. With help, you WILL be well. . Where can I find help? Medical professionals such as your OB, midwife, gynecologist, family practitioner, primary care provider, pediatrician, or mental health providers; [facility] support groups: Feelings After Birth, Breastfeeding Support Group, Baby and Me Group, and Fit 4 Two exercise classes. . You have permission to ask for help. It will confirm your feelings, validate your  experiences, share/learn coping strategies, and gain support and encouragement as you heal. You are important! BRAINSTORM . Make a list of local resources, including resources for mom and for partner. . Identify support groups. . Identify people to call late at night - include names and contact info. . Talk with partner about perinatal mood and anxiety disorders. . Talk with your OB, midwife, and doula about baby blues and about perinatal mood and anxiety disorders. . Talk with your pediatrician about perinatal mood and anxiety disorders.   Support & Sanity Savers   What do you really need?  . Basics . In preparing for a new baby, many expectant parents spend hours shopping for baby clothes, decorating the nursery, and deciding which car seat to   buy. Yet most don't think much about what the reality of parenting a newborn will be like, and what they need to make it through that. So, here is the advice of experienced parents. We know you'll read this, and think "they're exaggerating, I don't really need that." Just trust [place] on these, OK? Plan for all of this, and if it turns out you don't need it, come back and teach [place] how you did it!  Satira Anis (Once baby's survival needs are met, make sure you attend to your own survival needs!) . Sleep . An average newborn sleeps 16-18 hours per day, over 6-7 sleep periods, rarely more than three hours at a time. It is normal and healthy for a newborn to wake throughout the night... but really hard on parents!! . Naps. Prioritize sleep above any responsibilities like: cleaning house, visiting friends, running errands, etc.  Sleep whenever baby sleeps. If you can't nap, at least have restful times when baby eats. The more rest you get, the more patient you will be, the more emotionally stable, and better at solving problems.  . Food . You may not have realized it would be difficult to eat when you have a newborn. Yet, when we talk to . countless new  parents, they say things like "it may be 2:00 pm when I realize I haven't had breakfast yet." Or "every time we sit down to dinner, baby needs to eat, and my food gets cold, so I don't bother to eat it." . Finger food. Before your baby is born, stock up with one months' worth of food that: 1) you can eat with one hand while holding a baby, 2) doesn't need to be prepped, 3) is good hot or cold, 4) doesn't spoil when left out for a few hours, and 5) you like to eat. Think about: nuts, dried fruit, Clif bars, pretzels, jerky, gogurt, baby carrots, apples, bananas, crackers, cheez-n-crackers, string cheese, hot pockets or frozen burritos to microwave, garden burgers and breakfast pastries to put in the toaster, yogurt drinks, etc. . Restaurant Menus. Make lists of your favorite restaurants & menu items. When family/friends want to help, you can give specific information without much thought. They can either bring you the food or send gift cards for just the right meals. Rosaura Carpenter Meals.  Take some time to make a few meals to put in the freezer ahead of time.  Easy to freeze meals can be anything such as soup, lasagna, chicken pie, or spaghetti sauce. . Set up a Meal Schedule.  Ask friends and family to sign up to bring you meals during the first few weeks of being home. (It can be passed around at baby showers!) You have no idea how helpful this will be until you are in the throes of parenting.  https://hamilton-woodard.com/ is a great website to check out. . Emotional Support . Know who to call when you're stressed out. Parenting a newborn is very challenging work. There are times when it totally overwhelms your normal coping abilities. EVERY NEW PARENT NEEDS TO HAVE A PLAN FOR WHO TO CALL WHEN THEY JUST CAN'T COPE ANY MORE. (And it has to be someone other than the baby's other parent!) Before your baby is born, come up with at least one person you can call for support - write their phone number down and post it on the  refrigerator. Marland Kitchen Anxiety & Sadness. Baby blues are normal after pregnancy; however, there are more severe types of anxiety &  sadness which can occur and should not be ignored.  They are always treatable, but you have to take the first step by reaching out for help. [facility] offers a "Mom Talk" group which meets every Tuesday from 10 am - 11 am.  This group is for new moms who need support and connection after their babies are born.  Call [phone].  Marland Kitchen Really, Really Helpful (Plan for them! Make sure these happen often!!) . Physical Support with Taking Care of Yourselves . Asking friends and family. Before your baby is born, set up a schedule of people who can come and visit and help out (or ask a friend to schedule for you). Any time someone says "let me know what I can do to help," sign them up for a day. When they get there, their job is not to take care of the baby (that's your job and your joy). Their job is to take care of you!  . Postpartum doulas. If you don't have anyone you can call on for support, look into postpartum doulas:  professionals at helping parents with caring for baby, caring for themselves, getting breastfeeding started, and helping with household tasks. www.padanc.org is a helpful website for learning about doulas in our area. . Peer Support / Parent Groups . Why: One of the greatest ideas for new parents is to be around other new parents. Parent groups give you a chance to share and listen to others who are going through the same season of life, get a sense of what is normal infant development by watching several babies learn and grow, share your stories of triumph and struggles with empathetic ears, and forgive your own mistakes when you realize all parents are learning by trial and error. . Where to find: There are many places you can meet other new parents throughout our community.  [facility] offers the following classes for new moms and their little ones:  Baby  and Me (Birth to [place]) and Breastfeeding Support Group. Go to www.conehealthybaby.com or call [phone] for more information. . Time for your Relationship . It's easy to get so caught up in meeting baby's immediate needs that it's hard to find time to connect with your partner, and meet the needs of your relationship. It's also easy to forget what "quality time with your partner" actually looks like. If you take your baby on a date, you'd be amazed how much of your couple time is spent feeding the baby, diapering the baby, admiring the baby, and talking about the baby. . Dating: Try to take time for just the two of you. Babysitter tip: Sometimes when moms are breastfeeding a newborn, they find it hard to figure out how to schedule outings around baby's unpredictable feeding schedules. Have the babysitter come for a three hour period. When she comes over, if baby has just eaten, you can leave right away, and come back in two hours. If baby hasn't fed recently, you start the date at home. Once baby gets hungry and gets a good feeding in, you can head out for the rest of your date time. . Date Nights at Home: If you can't get out, at least set aside one evening a week to prioritize your relationship: whenever baby dozes off or doesn't have any immediate needs, spend a little time focusing on each other. . Potential conflicts: The main relationship conflicts that come up for new parents are: issues related to sexuality, financial stresses, a feeling of an unfair division  of household tasks, and conflicts in parenting styles. The more you can work on these issues before baby arrives, the better!  . Fun and Frills (Don't forget these. and don't feel guilty for indulging in them!) . Everyone has something in life that is a fun little treat that they do just for themselves. It may be: reading the morning paper, or going for a daily jog, or having coffee with a friend once a week, or going to a movie on Friday  nights, or fine chocolates, or bubble baths, or curling up with a good book. . Unless you do fun things for yourself every now and then, it's hard to have the energy for fun with your baby. Whatever your "special" treats are, make sure you find a way to continue to indulge in them after your baby is born. These special moments can recharge you, and allow you to return to baby with a new joy   PERINATAL MOOD DISORDERS: MATERNAL MENTAL HEALTH FROM CONCEPTION THROUGH THE POSTPARTUM PERIOD   Emergency and Crisis Resources:  If you are an imminent risk to self or others, are experiencing intense personal distress, and/or have noticed significant changes in activities of daily living, call:  . 911 . [facility]: [phone] . Mobile Crisis: 877-626-1772 . National Suicide Hotline: 1-800-273-8255 Or visit the following crisis centers: . Local Emergency Departments . [facility]: [address], [place] [phone]. Hours: 8:30AM-5PM. Insurance Accepted: Medicaid, Medicare, and Uninsured.  . [facility]  [address], [place] Mon-Friday 8am-3pm  [phone]                                                                                    Non-Crisis Resources: To identify specific providers that are covered by your insurance, contact your insurance company or local agencies: [facility]: 1-800-256-2452 [facility]: 888-581-9988 [employer]: 1-800-939-5911 Postpartum [employer]- [place] 1-800-944-4773                                                      Outpatient therapy and medication management providers:  [facility] [phone] Hours: 9AM-5PM  Insurance Accepted: AARP, Aetna, BCBS, Cigna, [employer], Humana, Medicare  [facility] [phone] Hours: 8AM-5PM  nsurance Accepted: All insurances EXCEPT AARP, Aetna, [employer], and  [employer] [facility]: [phone]             Hours: 8AM-8PM Insurance Accepted: Aetna, BCBS, Cigna, [employer], Medicaid, Medicare, Uninsured [facility]: [phone] [facility]: [phone] Hours: 8:30AM-7PM Insurance Accepted: Aetna, BCBS, Medicaid, Medicare, Tricare, [facility]:  [phone]              Hours:9AM-5PM Insurance Accepted:  Aetna, BCBS, [facility], Medicaid, United Health Care  [facility] [phone] Hours: 9AM-5:30PM Insurance Accepted: AARP, Aetna, BCBS, Cigna, and Medicaid, Medicare, United Health Care [facility]:  [phone] Hours: 9am-5pm Insurance Accepted: BCBS; they do not accept Medicaid/Medicare   [facility]: [phone] Hours: 9am-9pm Insurance Accepted: All major insurance including Medicaid and Medicare [facility]: [phone] Hours: 9AM-5:30PM Insurance Accepted: All insurances EXCEPT Medicaid and Medicare. [facility]: [phone]                                                                       Parenting Support Groups [facility]: [phone] [facility]:  [phone] Family Support Network (support for children in the NICU and/or with special needs), [phone]                                                                   Mental Health Support Groups Mental Health Association: [phone]                                                                                     Online Resources: Postpartum Support International: http://www.postpartum.net/  800-944-4PPD 2Moms Supporting Moms:  www.momssupportingmoms.net     

## 2021-02-26 ENCOUNTER — Encounter: Payer: Self-pay | Admitting: *Deleted

## 2021-02-26 ENCOUNTER — Other Ambulatory Visit: Payer: Self-pay

## 2021-02-26 ENCOUNTER — Ambulatory Visit: Payer: Medicaid Other | Admitting: *Deleted

## 2021-02-26 ENCOUNTER — Ambulatory Visit: Payer: Medicaid Other | Attending: Obstetrics

## 2021-02-26 DIAGNOSIS — O99213 Obesity complicating pregnancy, third trimester: Secondary | ICD-10-CM

## 2021-02-26 DIAGNOSIS — Z3A32 32 weeks gestation of pregnancy: Secondary | ICD-10-CM

## 2021-02-26 DIAGNOSIS — O09899 Supervision of other high risk pregnancies, unspecified trimester: Secondary | ICD-10-CM | POA: Diagnosis present

## 2021-02-26 DIAGNOSIS — Z362 Encounter for other antenatal screening follow-up: Secondary | ICD-10-CM | POA: Diagnosis not present

## 2021-02-26 DIAGNOSIS — O0990 Supervision of high risk pregnancy, unspecified, unspecified trimester: Secondary | ICD-10-CM | POA: Insufficient documentation

## 2021-02-26 DIAGNOSIS — O10919 Unspecified pre-existing hypertension complicating pregnancy, unspecified trimester: Secondary | ICD-10-CM | POA: Insufficient documentation

## 2021-02-26 DIAGNOSIS — O09213 Supervision of pregnancy with history of pre-term labor, third trimester: Secondary | ICD-10-CM

## 2021-02-26 DIAGNOSIS — O10013 Pre-existing essential hypertension complicating pregnancy, third trimester: Secondary | ICD-10-CM

## 2021-02-27 ENCOUNTER — Other Ambulatory Visit: Payer: Self-pay | Admitting: *Deleted

## 2021-02-27 DIAGNOSIS — O10913 Unspecified pre-existing hypertension complicating pregnancy, third trimester: Secondary | ICD-10-CM

## 2021-03-03 ENCOUNTER — Ambulatory Visit: Payer: Medicaid Other

## 2021-03-03 ENCOUNTER — Other Ambulatory Visit: Payer: Medicaid Other

## 2021-03-04 ENCOUNTER — Ambulatory Visit: Payer: Self-pay

## 2021-03-04 ENCOUNTER — Other Ambulatory Visit: Payer: Self-pay

## 2021-03-04 ENCOUNTER — Ambulatory Visit (INDEPENDENT_AMBULATORY_CARE_PROVIDER_SITE_OTHER): Payer: Medicaid Other | Admitting: *Deleted

## 2021-03-04 ENCOUNTER — Ambulatory Visit (INDEPENDENT_AMBULATORY_CARE_PROVIDER_SITE_OTHER): Payer: Medicaid Other | Admitting: Family Medicine

## 2021-03-04 ENCOUNTER — Other Ambulatory Visit: Payer: Medicaid Other

## 2021-03-04 VITALS — BP 141/77 | HR 99 | Wt 335.4 lb

## 2021-03-04 DIAGNOSIS — Z3A33 33 weeks gestation of pregnancy: Secondary | ICD-10-CM

## 2021-03-04 DIAGNOSIS — O09899 Supervision of other high risk pregnancies, unspecified trimester: Secondary | ICD-10-CM

## 2021-03-04 DIAGNOSIS — O10919 Unspecified pre-existing hypertension complicating pregnancy, unspecified trimester: Secondary | ICD-10-CM

## 2021-03-04 DIAGNOSIS — O0990 Supervision of high risk pregnancy, unspecified, unspecified trimester: Secondary | ICD-10-CM

## 2021-03-04 DIAGNOSIS — Z6841 Body Mass Index (BMI) 40.0 and over, adult: Secondary | ICD-10-CM

## 2021-03-04 DIAGNOSIS — O09299 Supervision of pregnancy with other poor reproductive or obstetric history, unspecified trimester: Secondary | ICD-10-CM

## 2021-03-04 NOTE — Progress Notes (Signed)
Pt denies H/A or visual disturbances.  

## 2021-03-04 NOTE — Progress Notes (Signed)
   PRENATAL VISIT NOTE  Subjective:  Jill Mullen is a 35 y.o. G3T5176 at [redacted]w[redacted]d being seen today for ongoing prenatal care.  She is currently monitored for the following issues for this high-risk pregnancy and has Supervision of high risk pregnancy, antepartum; Chronic hypertension affecting pregnancy; History of preterm delivery, currently pregnant; BMI 45.0-49.9, adult (HCC); Hx of preeclampsia, prior pregnancy, currently pregnant; Nausea and vomiting during pregnancy; Anxiety and depression; Spotting and cramping affecting pregnancy, antepartum; and Needle phobia on their problem list.  Patient reports no complaints.  Contractions: Not present. Vag. Bleeding: None.  Movement: Present. Denies leaking of fluid.   The following portions of the patient's history were reviewed and updated as appropriate: allergies, current medications, past family history, past medical history, past social history, past surgical history and problem list.   Objective:   Vitals:   03/04/21 0936  BP: (!) 141/77  Pulse: 99  Weight: (!) 335 lb 6.4 oz (152.1 kg)    Fetal Status: Fetal Heart Rate (bpm): NST   Movement: Present     General:  Alert, oriented and cooperative. Patient is in no acute distress.  Skin: Skin is warm and dry. No rash noted.   Cardiovascular: Normal heart rate noted  Respiratory: Normal respiratory effort, no problems with respiration noted  Abdomen: Soft, gravid, appropriate for gestational age.  Pain/Pressure: Absent     Pelvic: Cervical exam deferred        Extremities: Normal range of motion.  Edema: None  Mental Status: Normal mood and affect. Normal behavior. Normal judgment and thought content.   Assessment and Plan:  Pregnancy: [id number] at [redacted]w[redacted]d 1. [redacted] weeks gestation of pregnancy  2. Supervision of high risk pregnancy, antepartum FHT normal  3. Chronic hypertension affecting pregnancy Continue growth scans EFW 4#10oz on 3/24. BPP 10/10 today BP slightly elevated  today - didn't take procardia this AM. Will take when she gets home and will check BP.   4. Hx of preeclampsia, prior pregnancy, currently pregnant On ASA 81mg   5. BMI 45.0-49.9, adult (HCC)  6. History of preterm delivery, currently pregnant No signs of preterm labor  Preterm labor symptoms and general obstetric precautions including but not limited to vaginal bleeding, contractions, leaking of fluid and fetal movement were reviewed in detail with the patient. Please refer to After Visit Summary for other counseling recommendations.   Return in about 8 days (around 03/12/2021) for as scheduled.  Future Appointments  Date Time Provider Department Center  03/12/2021  8:20 AM [facility] LAB [facility]  03/12/2021  9:15 AM [facility] NST [facility]  03/19/2021  9:15 AM [facility] NST [facility]  03/19/2021 10:15 AM 03/21/2021, MD [facility]  03/23/2021  2:30 PM [facility] NURSE [facility]  03/23/2021  2:45 PM [facility] US5 [facility]  03/26/2021  8:00 AM Cozart, Paige Y, LCSW [facility] None  03/31/2021 11:00 AM 04/02/2021, NP [facility] None  04/02/2021  9:15 AM [facility] NST [facility]  04/02/2021 10:15 AM 04/04/2021, PA-C [facility]    [facility] DO

## 2021-03-12 ENCOUNTER — Other Ambulatory Visit: Payer: Self-pay

## 2021-03-12 ENCOUNTER — Ambulatory Visit: Payer: Medicaid Other | Admitting: *Deleted

## 2021-03-12 ENCOUNTER — Other Ambulatory Visit: Payer: Medicaid Other

## 2021-03-12 ENCOUNTER — Ambulatory Visit (INDEPENDENT_AMBULATORY_CARE_PROVIDER_SITE_OTHER): Payer: Medicaid Other

## 2021-03-12 VITALS — BP 136/87 | HR 106 | Wt 336.3 lb

## 2021-03-12 DIAGNOSIS — O10919 Unspecified pre-existing hypertension complicating pregnancy, unspecified trimester: Secondary | ICD-10-CM | POA: Diagnosis not present

## 2021-03-19 ENCOUNTER — Ambulatory Visit: Payer: Medicaid Other | Admitting: *Deleted

## 2021-03-19 ENCOUNTER — Ambulatory Visit (INDEPENDENT_AMBULATORY_CARE_PROVIDER_SITE_OTHER): Payer: Medicaid Other | Admitting: Obstetrics and Gynecology

## 2021-03-19 ENCOUNTER — Ambulatory Visit (INDEPENDENT_AMBULATORY_CARE_PROVIDER_SITE_OTHER): Payer: Medicaid Other

## 2021-03-19 VITALS — BP 140/73 | Wt 337.5 lb

## 2021-03-19 DIAGNOSIS — O09299 Supervision of pregnancy with other poor reproductive or obstetric history, unspecified trimester: Secondary | ICD-10-CM

## 2021-03-19 DIAGNOSIS — O09899 Supervision of other high risk pregnancies, unspecified trimester: Secondary | ICD-10-CM

## 2021-03-19 DIAGNOSIS — Z3A35 35 weeks gestation of pregnancy: Secondary | ICD-10-CM | POA: Insufficient documentation

## 2021-03-19 DIAGNOSIS — O10919 Unspecified pre-existing hypertension complicating pregnancy, unspecified trimester: Secondary | ICD-10-CM

## 2021-03-19 DIAGNOSIS — Z6841 Body Mass Index (BMI) 40.0 and over, adult: Secondary | ICD-10-CM

## 2021-03-19 DIAGNOSIS — O0990 Supervision of high risk pregnancy, unspecified, unspecified trimester: Secondary | ICD-10-CM

## 2021-03-19 MED ORDER — NIFEDIPINE ER OSMOTIC RELEASE 60 MG PO TB24: 60.0000 mg | ORAL_TABLET | Freq: Every day | ORAL | 2 refills | Status: DC

## 2021-03-19 NOTE — Progress Notes (Signed)
   PRENATAL VISIT NOTE  Subjective:  Jill Mullen is a 35 y.o. [phone] at [redacted]w[redacted]d being seen today for ongoing prenatal care.  She is currently monitored for the following issues for this high-risk pregnancy and has Supervision of high risk pregnancy, antepartum; Chronic hypertension affecting pregnancy; History of preterm delivery, currently pregnant; BMI 45.0-49.9, adult (HCC); Hx of preeclampsia, prior pregnancy, currently pregnant; Nausea and vomiting during pregnancy; Anxiety and depression; Spotting and cramping affecting pregnancy, antepartum; Needle phobia; and [redacted] weeks gestation of pregnancy on their problem list.  Patient doing well with no acute concerns today. She reports no complaints.  Contractions: Not present. Vag. Bleeding: None.  Movement: Present. Denies leaking of fluid.   The following portions of the patient's history were reviewed and updated as appropriate: allergies, current medications, past family history, past medical history, past social history, past surgical history and problem list. Problem list updated.  Objective:   Vitals:   03/19/21 0949  BP: 140/73  Weight: (!) 337 lb 8 oz (153.1 kg)    Fetal Status: Fetal Heart Rate (bpm): NST   Movement: Present     General:  Alert, oriented and cooperative. Patient is in no acute distress.  Skin: Skin is warm and dry. No rash noted.   Cardiovascular: Normal heart rate noted  Respiratory: Normal respiratory effort, no problems with respiration noted  Abdomen: Soft, gravid, appropriate for gestational age.  Pain/Pressure: Present     Pelvic: Cervical exam deferred        Extremities: Normal range of motion.     Mental Status:  Normal mood and affect. Normal behavior. Normal judgment and thought content.   Assessment and Plan:  Pregnancy: [id number] at [redacted]w[redacted]d  1. Supervision of high risk pregnancy, antepartum Continue weekly BPP  2. Chronic hypertension affecting pregnancy Med increased for tighter control,  discuss IOL at next visit after MFM scan, likely 37-38 weeks - NIFEdipine (PROCARDIA XL) 60 MG 24 hr tablet; Take 1 tablet (60 mg total) by mouth daily.  Dispense: 30 tablet; Refill: 2  3. [redacted] weeks gestation of pregnancy   4. Hx of preeclampsia, prior pregnancy, currently pregnant   5. History of preterm delivery, currently pregnant No s/sx of labor  6. BMI 45.0-49.9, adult (HCC)   Preterm labor symptoms and general obstetric precautions including but not limited to vaginal bleeding, contractions, leaking of fluid and fetal movement were reviewed in detail with the patient.  Please refer to After Visit Summary for other counseling recommendations.   Return in about 1 week (around 03/26/2021) for [facility], in person, 3rd trim labs, 36 weeks swabs.   Mariel Aloe, MD Faculty Attending Center for [facility]

## 2021-03-23 ENCOUNTER — Ambulatory Visit: Payer: Medicaid Other

## 2021-03-24 ENCOUNTER — Ambulatory Visit (HOSPITAL_BASED_OUTPATIENT_CLINIC_OR_DEPARTMENT_OTHER): Payer: Medicaid Other

## 2021-03-24 ENCOUNTER — Other Ambulatory Visit: Payer: Self-pay

## 2021-03-24 ENCOUNTER — Ambulatory Visit: Payer: Medicaid Other | Attending: Obstetrics | Admitting: *Deleted

## 2021-03-24 ENCOUNTER — Encounter: Payer: Self-pay | Admitting: *Deleted

## 2021-03-24 DIAGNOSIS — O99213 Obesity complicating pregnancy, third trimester: Secondary | ICD-10-CM | POA: Diagnosis not present

## 2021-03-24 DIAGNOSIS — O09293 Supervision of pregnancy with other poor reproductive or obstetric history, third trimester: Secondary | ICD-10-CM | POA: Insufficient documentation

## 2021-03-24 DIAGNOSIS — O10913 Unspecified pre-existing hypertension complicating pregnancy, third trimester: Secondary | ICD-10-CM | POA: Diagnosis not present

## 2021-03-24 DIAGNOSIS — O10013 Pre-existing essential hypertension complicating pregnancy, third trimester: Secondary | ICD-10-CM | POA: Diagnosis not present

## 2021-03-24 DIAGNOSIS — E669 Obesity, unspecified: Secondary | ICD-10-CM | POA: Diagnosis not present

## 2021-03-24 DIAGNOSIS — Z3A36 36 weeks gestation of pregnancy: Secondary | ICD-10-CM | POA: Diagnosis not present

## 2021-03-24 DIAGNOSIS — O0990 Supervision of high risk pregnancy, unspecified, unspecified trimester: Secondary | ICD-10-CM

## 2021-03-24 DIAGNOSIS — O10919 Unspecified pre-existing hypertension complicating pregnancy, unspecified trimester: Secondary | ICD-10-CM

## 2021-03-24 DIAGNOSIS — O09213 Supervision of pregnancy with history of pre-term labor, third trimester: Secondary | ICD-10-CM | POA: Diagnosis not present

## 2021-03-24 DIAGNOSIS — O09899 Supervision of other high risk pregnancies, unspecified trimester: Secondary | ICD-10-CM

## 2021-03-25 ENCOUNTER — Other Ambulatory Visit (HOSPITAL_COMMUNITY)
Admission: RE | Admit: 2021-03-25 | Discharge: 2021-03-25 | Disposition: A | Discharge status: Home / Self Care | Payer: Medicaid Other | Source: Ambulatory Visit | Attending: Family Medicine | Admitting: Family Medicine

## 2021-03-25 ENCOUNTER — Ambulatory Visit (INDEPENDENT_AMBULATORY_CARE_PROVIDER_SITE_OTHER): Payer: Medicaid Other | Admitting: Family Medicine

## 2021-03-25 ENCOUNTER — Encounter: Payer: Self-pay | Admitting: Family Medicine

## 2021-03-25 VITALS — BP 132/84 | HR 87 | Wt 337.9 lb

## 2021-03-25 DIAGNOSIS — O09299 Supervision of pregnancy with other poor reproductive or obstetric history, unspecified trimester: Secondary | ICD-10-CM

## 2021-03-25 DIAGNOSIS — O0990 Supervision of high risk pregnancy, unspecified, unspecified trimester: Secondary | ICD-10-CM | POA: Insufficient documentation

## 2021-03-25 DIAGNOSIS — O10919 Unspecified pre-existing hypertension complicating pregnancy, unspecified trimester: Secondary | ICD-10-CM

## 2021-03-25 LAB — POCT URINALYSIS DIP (DEVICE)
Bilirubin Urine: NEGATIVE
Glucose, UA: NEGATIVE mg/dL
Hgb urine dipstick: NEGATIVE
Ketones, ur: NEGATIVE mg/dL
Leukocytes,Ua: NEGATIVE
Nitrite: NEGATIVE
Protein, ur: NEGATIVE mg/dL
Specific Gravity, Urine: >=1.03 (ref 1.005–1.030)
Urobilinogen, UA: 0.2 mg/dL (ref 0.0–1.0)
pH: 6.5 (ref 5.0–8.0)

## 2021-03-25 NOTE — Patient Instructions (Signed)

## 2021-03-25 NOTE — Progress Notes (Signed)
   PRENATAL VISIT NOTE  Subjective:  Jill Mullen is a 35 y.o. [id number] at [redacted]w[redacted]d being seen today for ongoing prenatal care.  She is currently monitored for the following issues for this high-risk pregnancy and has Supervision of high risk pregnancy, antepartum; Chronic hypertension affecting pregnancy; History of preterm delivery, currently pregnant; BMI 45.0-49.9, adult (HCC); Hx of preeclampsia, prior pregnancy, currently pregnant; Nausea and vomiting during pregnancy; Anxiety and depression; Spotting and cramping affecting pregnancy, antepartum; and Needle phobia on their problem list.  Patient reports no complaints.  Contractions: Irritability. Vag. Bleeding: None.  Movement: Present. Denies leaking of fluid.   The following portions of the patient's history were reviewed and updated as appropriate: allergies, current medications, past family history, past medical history, past social history, past surgical history and problem list.   Objective:   Vitals:   03/25/21 1007  BP: 132/84  Pulse: 87  Weight: (!) 337 lb 14.4 oz (153.3 kg)    Fetal Status: Fetal Heart Rate (bpm): 138   Movement: Present  Presentation: Vertex  General:  Alert, oriented and cooperative. Patient is in no acute distress.  Skin: Skin is warm and dry. No rash noted.   Cardiovascular: Normal heart rate noted  Respiratory: Normal respiratory effort, no problems with respiration noted  Abdomen: Soft, gravid, appropriate for gestational age.  Pain/Pressure: Present     Pelvic: Cervical exam performed in the presence of a chaperone Dilation: 2 Effacement (%): 40 Station: -2  Extremities: Normal range of motion.     Mental Status: Normal mood and affect. Normal behavior. Normal judgment and thought content.   Assessment and Plan:  Pregnancy: [id number] at [redacted]w[redacted]d 1. Chronic hypertension affecting pregnancy On Labetalol and ASA BP is well controlled Normal growth Plan for IOL at 38 wks. - orders placed and wants  Cytotec and AROM only--no pit  2. Supervision of high risk pregnancy, antepartum Cultures today Notes she has declined 28 wk labs and glucola screening - Culture, beta strep (group b only) - GC/Chlamydia probe amp (Jay)not at [facility]  3. Hx of preeclampsia, prior pregnancy, currently pregnant No evidence right now   Preterm labor symptoms and general obstetric precautions including but not limited to vaginal bleeding, contractions, leaking of fluid and fetal movement were reviewed in detail with the patient. Please refer to After Visit Summary for other counseling recommendations.   Return in 1 week (on 04/01/2021).  Future Appointments  Date Time Provider Department Center  03/26/2021  8:00 AM Cozart, Neena Rhymes, LCSW [facility] None  03/31/2021 11:00 AM Shanna Cisco, NP [facility] None  04/02/2021  9:15 AM [facility] NST [facility]  04/02/2021 10:15 AM Marny Lowenstein, PA-C [facility]    Reva Bores, MD

## 2021-03-26 ENCOUNTER — Ambulatory Visit (INDEPENDENT_AMBULATORY_CARE_PROVIDER_SITE_OTHER): Payer: Medicaid Other | Admitting: Clinical

## 2021-03-26 ENCOUNTER — Other Ambulatory Visit: Payer: Self-pay

## 2021-03-26 DIAGNOSIS — F331 Major depressive disorder, recurrent, moderate: Secondary | ICD-10-CM | POA: Diagnosis not present

## 2021-03-26 LAB — GC/CHLAMYDIA PROBE AMP (~~LOC~~) NOT AT ARMC
Chlamydia: NEGATIVE
Comment: NEGATIVE
Comment: NORMAL
Neisseria Gonorrhea: NEGATIVE

## 2021-03-26 NOTE — Progress Notes (Signed)
Comprehensive Clinical Assessment (CCA) Note  03/27/2021 Jill Mullen [id number]  Chief Complaint:  Chief Complaint  Patient presents with  . Anxiety  . Depression   Visit Diagnosis:  Major depressive disorder,recurrent episode, moderate w/ anxious distress   Interpretive Summary:   Client is a 35 year old female presenting to [facility] for outpatient behavioral health services. Client presents by referral of friends for a clinical assessment. Client presents with the chief complaint of depression and anxiety. Client endorses depressed mood, insomnia and excessive worrying that have been ongoing for "awhile", more than one year. Client reported her symptoms cause her to be withdrawn. Client reported overall having trouble with doing daily activities due to loss of interest and motivation. Client reported her pregnancy worsened these symptoms. Client reported her OBGYN is prescribing her Sertraline which has been somewhat helpful for the management of her symptoms. Client reported therapy services as a child but not as an adult. Client reported no history of inpatient treatment for mental health reasons. Client denied history of suicidal ideations. Client denied substance use history.  Client presented oriented times five, appropriately dressed, friendly, and cooperative. Client denied suicidal, homicidal, hallucinations and delusions. Client was screened for pain, nutrition, [place] suicide severity and the following SDOH:  GAD 7 : Generalized Anxiety Score 03/26/2021 03/25/2021 03/19/2021 03/12/2021  Nervous, Anxious, on Edge 1 0 0 0  Control/stop worrying 1 0 0 0  Worry too much - different things 3 1 1  0  Trouble relaxing 1 0 0 0  Restless 0 0 1 0  Easily annoyed or irritable 2 0 0 1  Afraid - awful might happen 1 0 0 0  Total GAD 7 Score 9 1 2 1   Anxiety Difficulty Very difficult - - from 03/26/2021 in [facility]  PHQ-2 Total  Score 5     Flowsheet Row Counselor from 03/26/2021 in [facility]  PHQ-9 Total Score 19     Flowsheet Row Counselor from 03/26/2021 in [facility]  C-SSRS RISK CATEGORY No Risk      Treatment recommendations: individual therapy, psychiatric evaluation and medication management  Therapist provided information on format of appointment (virtual or face to face).  The client was advised to call back or seek an in-person evaluation if the symptoms worsen or if the condition fails to improve as anticipated before the next scheduled appointment. Client was in agreement with treatment recommendations.     CCA Biopsychosocial Intake/Chief Complaint:  Client reported she is presenting by referral of a friend for ongoing symptoms of depression and anxiety. Client reported she is due to give birth in a few weeks, but she has had these symptoms for a few years.  Current Symptoms/Problems: Client reported depressed mood and excessive worry.   Patient Reported Schizophrenia/Schizoaffective Diagnosis in Past: No  Type of Services Patient Feels are Needed: psychiatric evaluation, medication management, and individual therapy   Initial Clinical Notes/Concerns: No data recorded  Mental Health Symptoms Depression:  Change in energy/activity; Difficulty Concentrating; Hopelessness; Increase/decrease in appetite; Sleep (too much or little)   Duration of Depressive symptoms: Greater than two weeks   Mania:  None   Anxiety:   Difficulty concentrating; Tension; Worrying   Psychosis:  None   Duration of Psychotic symptoms: No data recorded  Trauma:  None   Obsessions:  None   Compulsions:  None   Inattention:  None   Hyperactivity/Impulsivity:  N/A   Oppositional/Defiant Behaviors:  None   Emotional Irregularity:  None   Other Mood/Personality Symptoms:  No data recorded   Mental Status Exam Appearance and self-care   Stature:  Tall   Weight:  Average weight (Pregnant)   Clothing:  Casual   Grooming:  Normal   Cosmetic use:  Age appropriate   Posture/gait:  Normal   Motor activity:  Not Remarkable   Sensorium  Attention:  Normal   Concentration:  Normal   Orientation:  X5   Recall/memory:  Normal   Affect and Mood  Affect:  Congruent   Mood:  Euthymic   Relating  Eye contact:  Normal   Facial expression:  Responsive   Attitude toward examiner:  Cooperative   Thought and Language  Speech flow: Clear and Coherent   Thought content:  Appropriate to Mood and Circumstances   Preoccupation:  None   Hallucinations:  None   Organization:  No data recorded  [employer] of Knowledge:  Good   Intelligence:  Average   Abstraction:  Normal   Judgement:  Good   Reality Testing:  Adequate   Insight:  Good   Decision Making:  Normal   Social Functioning  Social Maturity:  Responsible; Isolates   Social Judgement:  Normal   Stress  Stressors:  Transitions; Family conflict   Coping Ability:  Normal   Skill Deficits:  Activities of daily living   Supports:  Family     Religion: Religion/Spirituality Are You A Religious Person?: No  Leisure/Recreation: Leisure / Recreation Do You Have Hobbies?: Yes  Exercise/Diet: Exercise/Diet Do You Exercise?: No Have You Gained or Lost A Significant Amount of Weight in the Past Six Months?: No Do You Follow a Special Diet?: No Do You Have Any Trouble Sleeping?: Yes   CCA Employment/Education Employment/Work Situation: Employment / Work Situation Employment situation: Unemployed  Education: Education Is Patient Currently Attending School?: Yes School Currently Attending: [employer] Did [place] Attend Graduate School?: Yes What is Your Occupational psychologist?: 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  Childhood History:  Childhood History By whom was/is the patient raised?: Grandparents Additional childhood history information: Client reported she is from [place], raised by her maternal grandmother. Client reported her childhood was lonely. Client reported having a nonexistent relationship with her parents as a child. Client reported it is still the same currently, they don't have a relationship. Client reported her mom gave her to her grandmother when she was 65 months old, and her dad has been on drugs her entire life (heroin). Does patient have siblings?: Yes Number of Siblings: 7 Description of patient's current relationship with siblings: Cilent reported she does not have a relationship with her siblings Did patient suffer any verbal/emotional/physical/sexual abuse as a child?: No Did patient suffer from severe childhood neglect?: No Has patient ever been sexually abused/assaulted/raped as an adolescent or adult?: No Was the patient ever a victim of a crime or a disaster?: No Witnessed domestic violence?: No Has patient been affected by domestic violence as an adult?: No  Child/Adolescent Assessment:     CCA Substance Use Alcohol/Drug Use: Alcohol / Drug Use History of alcohol / drug use?:  No history of alcohol / drug abuse                         ASAM's:  Six Dimensions of Multidimensional Assessment  Dimension 1:  Acute Intoxication and/or Withdrawal Potential:      Dimension 2:  Biomedical Conditions and Complications:      Dimension 3:  Emotional, Behavioral, or Cognitive Conditions and Complications:     Dimension 4:  Readiness to Change:     Dimension 5:   Relapse, Continued use, or Continued Problem Potential:     Dimension 6:  Recovery/Living Environment:     ASAM Severity Score:    ASAM Recommended Level of Treatment:     Substance use Disorder (SUD)    Recommendations for Services/Supports/Treatments: Recommendations for Services/Supports/Treatments Recommendations For Services/Supports/Treatments: Individual Therapy,Medication Management  DSM5 Diagnoses: Patient Active Problem List   Diagnosis Date Noted  . Major depressive disorder, recurrent episode, moderate with anxious distress (HCC) 03/27/2021  . 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 Spotting and cramping affecting pregnancy, antepartum 12/30/2020  . Anxiety and depression 12/17/2020  . BMI 45.0-49.9, adult (HCC) 10/22/2020  . Hx of preeclampsia, prior pregnancy, currently pregnant 10/22/2020  . Nausea and vomiting during pregnancy 10/22/2020  . Supervision of high risk pregnancy, antepartum 09/12/2020  . Chronic hypertension affecting pregnancy   . History of preterm delivery, currently pregnant     Patient Centered Plan: Patient is on the following Treatment Plan(s):  Depression   Referrals to Alternative Service(s): Referred to Alternative Service(s):   Place:   Date:   Time:    Referred to Alternative Service(s):   Place:   Date:   Time:    Referred to Alternative Service(s):   Place:   Date:   Time:    Referred to Alternative Service(s):   Place:   Date:   Time:     Loree Fee, LCSW

## 2021-03-27 ENCOUNTER — Encounter: Payer: Self-pay | Admitting: *Deleted

## 2021-03-27 ENCOUNTER — Encounter (HOSPITAL_COMMUNITY): Payer: Self-pay | Admitting: *Deleted

## 2021-03-27 ENCOUNTER — Telehealth (HOSPITAL_COMMUNITY): Payer: Self-pay | Admitting: *Deleted

## 2021-03-27 DIAGNOSIS — F331 Major depressive disorder, recurrent, moderate: Secondary | ICD-10-CM | POA: Insufficient documentation

## 2021-03-27 NOTE — Telephone Encounter (Signed)
Preadmission screen  

## 2021-03-29 LAB — CULTURE, BETA STREP (GROUP B ONLY): Strep Gp B Culture: NEGATIVE

## 2021-03-31 ENCOUNTER — Ambulatory Visit (INDEPENDENT_AMBULATORY_CARE_PROVIDER_SITE_OTHER): Payer: Medicaid Other | Admitting: Psychiatry

## 2021-03-31 ENCOUNTER — Encounter (HOSPITAL_COMMUNITY): Payer: Self-pay | Admitting: Psychiatry

## 2021-03-31 ENCOUNTER — Other Ambulatory Visit: Payer: Self-pay | Admitting: Advanced Practice Midwife

## 2021-03-31 ENCOUNTER — Other Ambulatory Visit: Payer: Self-pay

## 2021-03-31 VITALS — BP 118/73 | HR 84 | Ht 69.0 in | Wt 335.0 lb

## 2021-03-31 DIAGNOSIS — F331 Major depressive disorder, recurrent, moderate: Secondary | ICD-10-CM

## 2021-03-31 DIAGNOSIS — F411 Generalized anxiety disorder: Secondary | ICD-10-CM

## 2021-03-31 MED ORDER — TRAZODONE HCL 50 MG PO TABS: 50.0000 mg | ORAL_TABLET | Freq: Every evening | ORAL | 2 refills | Status: AC | PRN

## 2021-03-31 MED ORDER — SERTRALINE HCL 100 MG PO TABS: 100.0000 mg | ORAL_TABLET | Freq: Every day | ORAL | 2 refills | Status: DC

## 2021-03-31 NOTE — Progress Notes (Signed)
Psychiatric Initial Adult Assessment   Patient Identification: Jill Mullen MRN:  [id number] Date of Evaluation:  03/31/2021 Referral Source: Walk-in Chief Complaint:   I have so much going on" Chief Complaint    Depression    " Visit Diagnosis:    ICD-10-CM   1. Generalized anxiety disorder  F41.1 traZODone (DESYREL) 50 MG tablet    sertraline (ZOLOFT) 100 MG tablet  2. Major depressive disorder, recurrent episode, moderate (HCC)  F33.1 sertraline (ZOLOFT) 100 MG tablet    History of Present Illness: 35 year old female seen today for initial psychiatric evaluation.  She walked into the clinic for medication management.  She has a psychiatric history of depression and is currently managed on Zoloft 50 mg.  She notes her medications are somewhat effective in managing her psychiatric conditions.  Today she is well-groomed, pleasant, cooperative, engaged in conversation, and maintains eye contact.  She informed Probation officer that she has a lot going on.  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  She is also 9 months pregnant and reports that her pregnancy was not planned.  She also informed provider that she is in the process of being evicted and is concerned about having stable housing for her and her children.  She notes that her husband is a English as a second language teacher and is currently pursuing options with the [place].  Patient notes that her sleep has been poor noting that she sleeps 0 to 4 hours nightly. A PHQ-9 was done on 03/26/2021 by patient's counselor and patient scored a 86.  A GAD-7 was also conducted on that day and patient scored a 9.  Today she endorses passive SI however notes that she does not want to harm herself.  She denies SI/HI/VAH or paranoia.  Patient not that growing up she felt abandoned.  She notes that her mother gave her to her grandmother when she was 13 months old.  She also notes that her father abused illegal substances and was in and out of her  life.  Patient notes that she has poor relationships with her family members and often feels that she is not a good mother.  She informed Probation officer that she avoids situations that remind her of her parents and has a fear of relationships because she feels that she will be abandoned.  Today she is agreeable to increasing Zoloft 50 mg to 100 mg to help manage anxiety and depression.  She is also agreeable to starting trazodone 25mg  to 50 mg as needed to help manage sleep.  Provider informed patient that trazodone during breast-feeding can cause increased sedation for her child.  She notes that she will pump in the day prior to taking trazodone in order to decrease sedation and her child. Potential side effects of medication and risks vs benefits of treatment vs non-treatment were explained and discussed. All questions were answered.  She will follow-up with outpatient counseling for therapy.  No other concerns at this time.  Associated Signs/Symptoms: Depression Symptoms:  depressed mood, anhedonia, insomnia, psychomotor retardation, fatigue, feelings of worthlessness/guilt, hopelessness, suicidal thoughts without plan, anxiety, panic attacks, loss of energy/fatigue, disturbed sleep, (Hypo) Manic Symptoms:  Flight of Ideas, Irritable Mood, Anxiety Symptoms:  Excessive Worry, Panic Symptoms, Psychotic Symptoms:  None PTSD Symptoms: Had a traumatic exposure:  Notes that she feels abandoned by her mother who left her at 4 months with her grandmother. SHe also notes that her father was on substances.  Avoidance:  Decreased Interest/Participation  Past Psychiatric History: Depression  Previous Psychotropic Medications: Zoloft  Substance Abuse History in the last 12 months:  No.  Consequences of Substance Abuse: NA  Past Medical History:  Past Medical History:  Diagnosis Date  . History of gestational hypertension   . Hypertension   . Pregnancy induced hypertension   . Preterm labor    . Vaginal Pap smear, abnormal 2005    Past Surgical History:  Procedure Laterality Date  . TONSILLECTOMY      Family Psychiatric History: Mom depression and father substance use.  Family History:  Family History  Problem Relation Age of Onset  . Depression Mother   . Obesity Mother   . Drug abuse Father   . Obesity Father   . ADD / ADHD Sister   . ADD / ADHD Brother   . ADD / ADHD Daughter   . Diabetes Daughter   . Intellectual disability Daughter   . Learning disabilities Daughter   . ADD / ADHD Son   . Arthritis Maternal Grandmother   . Cancer Maternal Grandmother   . Cancer Maternal Grandfather     Social History:   Social History   Socioeconomic History  . Marital status: Married    Spouse name: Dru  . Number of children: Not on file  . Years of education: Not on file  . Highest education level: Not on file  Occupational History  . Not on file  Tobacco Use  . Smoking status: Never Smoker  . Smokeless tobacco: Never Used  Vaping Use  . Vaping Use: Never used  Substance and Sexual Activity  . Alcohol use: Not Currently    Comment: rarely  . Drug use: Yes    Types: Marijuana    Comment: seldom  . Sexual activity: Yes    Birth control/protection: None  Other Topics Concern  . Not on file  Social History Narrative  . Not on file   Social Determinants of Health   Financial Resource Strain: Not on file  Food Insecurity: No Food Insecurity  . Worried About Charity fundraiser in the Last Year: Never true  . Ran Out of Food in the Last Year: Never true  Transportation Needs: No Transportation Needs  . Lack of Transportation (Medical): No  . Lack of Transportation (Non-Medical): No  Physical Activity: Not on file  Stress: Not on file  Social Connections: Not on file    Additional Social History: She resides in [place] with her husband and 3 children.  She is currently pregnant with her fourth child.  She is currently unemployed and is a  stay-at-home mother.  She denies alcohol, tobacco, or illegal drug use.  Allergies:   Allergies  Allergen Reactions  . Penicillins Anaphylaxis and Hives    Metabolic Disorder Labs: No results found for: HGBA1C, MPG No results found for: PROLACTIN No results found for: CHOL, TRIG, HDL, CHOLHDL, VLDL, LDLCALC No results found for: TSH  Therapeutic Level Labs: No results found for: LITHIUM No results found for: CBMZ No results found for: VALPROATE  Current Medications: Current Outpatient Medications  Medication Sig Dispense Refill  . aspirin 81 MG chewable tablet Chew 1 tablet (81 mg total) by mouth daily. 30 tablet 7  . Blood Pressure Monitoring (BLOOD PRESSURE KIT) DEVI 1 Device by Does not apply route as needed. 1 each 0  . NIFEdipine (PROCARDIA XL) 60 MG 24 hr tablet Take 1 tablet (60 mg total) by mouth daily. 30 tablet 2  . omeprazole (PRILOSEC)  40 MG capsule Take 1 capsule (40 mg total) by mouth daily. 30 capsule 3  . Prenatal Vit-Fe Fumarate-FA (PRENATAL VITAMINS) 28-0.8 MG TABS Take 1 tablet by mouth daily.    . promethazine (PHENERGAN) 25 MG tablet Take 1 tablet (25 mg total) by mouth every 6 (six) hours as needed for nausea or vomiting. 30 tablet 1  . sertraline (ZOLOFT) 100 MG tablet Take 1 tablet (100 mg total) by mouth daily. 30 tablet 2  . traZODone (DESYREL) 50 MG tablet Take 1 tablet (50 mg total) by mouth at bedtime as needed for sleep. 30 tablet 2   No current facility-administered medications for this visit.    Musculoskeletal: Strength & Muscle Tone: within normal limits Gait & Station: normal Patient leans: N/A  Psychiatric Specialty Exam: Review of Systems  Blood pressure 118/73, pulse 84, height $RemoveBe'5\' 9"'dXTQBZpBm$  (1.753 m), weight (!) 335 lb (152 kg), last menstrual period 06/29/2020, unknown if currently breastfeeding.Body mass index is 49.47 kg/m.  General Appearance: Well Groomed  Eye Contact:  Good  Speech:  Clear and Coherent and Normal Rate  Volume:  Normal   Mood:  Anxious and Depressed  Affect:  Appropriate and Congruent  Thought Process:  Coherent, Goal Directed and Linear  Orientation:  Full (Time, Place, and Person)  Thought Content:  WDL and Logical  Suicidal Thoughts:  Yes.  without intent/plan  Homicidal Thoughts:  No  Memory:  Immediate;   Good Recent;   Good Remote;   Good  Judgement:  Good  Insight:  Good  Psychomotor Activity:  Normal  Concentration:  Concentration: Good and Attention Span: Good  Recall:  Good  Fund of Knowledge:Good  Language: Good  Akathisia:  No  Handed:  Right  AIMS (if indicated):  Not done  Assets:  Communication Skills Desire for Improvement Financial Resources/Insurance Housing Intimacy Leisure Time Social Support  ADL's:  Intact  Cognition: WNL  Sleep:  Poor   Screenings: GAD-7   Health and safety inspector from 03/26/2021 in [facility] Routine Prenatal from 03/25/2021 in [facility] Clinical Support from 03/19/2021 in Center for [employer] at [facility] for [facility] Clinical Support from 03/12/2021 in [facility] for [employer] at [facility] for Women Clinical Support from 03/04/2021 in [facility] for [employer] at [facility]  Total GAD-7 Score $RemoveBef'9 1 2 1 1    'iZxmntNkGD$ [employer]   Flowsheet Row Counselor from 03/26/2021 in [facility] Routine Prenatal from 03/25/2021 in [facility] for [place] [facility] Clinical Support from 03/19/2021 in [facility] for [employer] at [facility] for Women Clinical Support from 03/12/2021 in Center for [employer] at [facility] for [facility] Clinical Support from 03/04/2021 in Center for [employer] at [facility] for [facility]  PHQ-2 Total Score 5 1 0 0 0  PHQ-9 Total Score 19 3 0 0 2    Flowsheet Row Counselor from 03/26/2021 in [place] No Risk      Assessment and Plan: Patient endorses symptoms of anxiety, depression, and insomnia.  Today she is agreeable to increasing Zoloft 50 mg to 100 mg to help manage anxiety and depression.  She will start trazodone 25 mg to 50 mg as needed for sleep.  Patient is currently pregnant and will have her child delivered via C-section on Apr 10, 2021.  Provider instructed patient that trazodone can be excreted in her breastmilk and cause her baby to be sedated.  She endorsed understanding and noted that she will pump in the day prior to taking trazodone to reduce sedation.  She will follow-up with outpatient counseling for therapy.  No other concerns noted at this time.  1. Generalized anxiety disorder  Start- traZODone (DESYREL) 50 MG tablet; Take 1 tablet (50 mg total) by mouth at bedtime as needed for sleep.  Dispense: 30 tablet; Refill: 2 Increased- sertraline (ZOLOFT) 100 MG tablet; Take 1 tablet (100 mg total) by mouth daily.  Dispense: 30 tablet; Refill: 2  2. Major depressive disorder, recurrent episode, moderate (HCC)  Increased- sertraline (ZOLOFT) 100 MG tablet; Take 1 tablet (100 mg total) by mouth daily.  Dispense: 30 tablet; Refill: 2  Follow-up in 3 months Follow-up with therapy  Salley Slaughter, NP 4/26/202211:48 AM

## 2021-04-02 ENCOUNTER — Ambulatory Visit (INDEPENDENT_AMBULATORY_CARE_PROVIDER_SITE_OTHER): Payer: Medicaid Other

## 2021-04-02 ENCOUNTER — Ambulatory Visit (INDEPENDENT_AMBULATORY_CARE_PROVIDER_SITE_OTHER): Payer: Medicaid Other | Admitting: Medical

## 2021-04-02 ENCOUNTER — Ambulatory Visit: Payer: Medicaid Other | Admitting: *Deleted

## 2021-04-02 VITALS — BP 124/73 | HR 95 | Wt 334.5 lb

## 2021-04-02 DIAGNOSIS — O09299 Supervision of pregnancy with other poor reproductive or obstetric history, unspecified trimester: Secondary | ICD-10-CM

## 2021-04-02 DIAGNOSIS — O10919 Unspecified pre-existing hypertension complicating pregnancy, unspecified trimester: Secondary | ICD-10-CM

## 2021-04-02 DIAGNOSIS — Z3A37 37 weeks gestation of pregnancy: Secondary | ICD-10-CM

## 2021-04-02 DIAGNOSIS — O0990 Supervision of high risk pregnancy, unspecified, unspecified trimester: Secondary | ICD-10-CM

## 2021-04-02 DIAGNOSIS — O09899 Supervision of other high risk pregnancies, unspecified trimester: Secondary | ICD-10-CM

## 2021-04-02 DIAGNOSIS — Z6841 Body Mass Index (BMI) 40.0 and over, adult: Secondary | ICD-10-CM

## 2021-04-02 NOTE — Progress Notes (Signed)
Pt denies all sx of pre-eclampsia. [place] for growth done on 4/19.   IOL is scheduled on 5/6.

## 2021-04-02 NOTE — Progress Notes (Signed)
   PRENATAL VISIT NOTE  Subjective:  Jill Mullen is a 35 y.o. [phone] at [redacted]w[redacted]d being seen today for ongoing prenatal care.  She is currently monitored for the following issues for this high-risk pregnancy and has Supervision of high risk pregnancy, antepartum; Chronic hypertension affecting pregnancy; History of preterm delivery, currently pregnant; BMI 45.0-49.9, adult (HCC); Hx of preeclampsia, prior pregnancy, currently pregnant; Nausea and vomiting during pregnancy; Anxiety and depression; Spotting and cramping affecting pregnancy, antepartum; Needle phobia; and Major depressive disorder, recurrent episode, moderate with anxious distress (HCC) on their problem list.  Patient reports no complaints.  Contractions: Irregular. Vag. Bleeding: None.  Movement: Present. Denies leaking of fluid.   The following portions of the patient's history were reviewed and updated as appropriate: allergies, current medications, past family history, past medical history, past social history, past surgical history and problem list.   Objective:   Vitals:   04/02/21 1003  BP: 124/73  Pulse: 95  Weight: (!) 334 lb 8 oz (151.7 kg)    Fetal Status: Fetal Heart Rate (bpm): NST   Movement: Present     General:  Alert, oriented and cooperative. Patient is in no acute distress.  Skin: Skin is warm and dry. No rash noted.   Cardiovascular: Normal heart rate noted  Respiratory: Normal respiratory effort, no problems with respiration noted  Abdomen: Soft, gravid, appropriate for gestational age.  Pain/Pressure: Present     Pelvic: Cervical exam deferred        Extremities: Normal range of motion.     Mental Status: Normal mood and affect. Normal behavior. Normal judgment and thought content.   Assessment and Plan:  Pregnancy: [id number] at [redacted]w[redacted]d 1. Supervision of high risk pregnancy, antepartum - GBS negative discussed from previous visit - Planning PP IUD, discussed at length today risks and benefits    2. Chronic hypertension affecting pregnancy - Normotensive today - NST Reactive Fetal Monitoring: Baseline: 130 bpm Variability: moderate Accelerations: 15 x 15 Decelerations: none Contractions: none - Plan for IOL at 38 weeks per MFM recommendations, scheduled for 04/07/21 - Concerned about Pitocin use, discussed this may be needed to avoid C/S, patient voiced understanding   3. Hx of preeclampsia, prior pregnancy, currently pregnant - Asymptomatic today   4. History of preterm delivery, currently pregnant  5. BMI 45.0-49.9, adult (HCC) - ASA  6. [redacted] weeks gestation of pregnancy  Term labor symptoms and general obstetric precautions including but not limited to vaginal bleeding, contractions, leaking of fluid and fetal movement were reviewed in detail with the patient. Please refer to After Visit Summary for other counseling recommendations.   Return for IOL on 5/3.  Future Appointments  Date Time Provider Department Center  04/07/2021 12:00 AM [facility] SCHED ROOM [facility] None  04/08/2021  9:30 AM [facility]-SCREENING [facility] None  05/19/2021  3:00 PM Cozart, Neena Rhymes, LCSW [facility] None  06/04/2021  2:00 PM Cozart, Neena Rhymes, LCSW [facility] None  06/30/2021 11:00 AM Shanna Cisco, NP [facility] None    Vonzella Nipple, PA-C

## 2021-04-02 NOTE — Patient Instructions (Signed)
Fetal Movement Counts Patient Name: ________________________________________________ Patient Due Date: ____________________  What is a fetal movement count? A fetal movement count is the number of times that you feel your baby move during a certain amount of time. This may also be called a fetal kick count. A fetal movement count is recommended for every pregnant woman. You may be asked to start counting fetal movements as early as week 28 of your pregnancy. Pay attention to when your baby is most active. You may notice your baby's sleep and wake cycles. You may also notice things that make your baby move more. You should do a fetal movement count:  When your baby is normally most active.  At the same time each day. A good time to count movements is while you are resting, after having something to eat and drink. How do I count fetal movements? 1. Find a quiet, comfortable area. Sit, or lie down on your side. 2. Write down the date, the start time and stop time, and the number of movements that you felt between those two times. Take this information with you to your health care visits. 3. Write down your start time when you feel the first movement. 4. Count kicks, flutters, swishes, rolls, and jabs. You should feel at least 10 movements. 5. You may stop counting after you have felt 10 movements, or if you have been counting for 2 hours. Write down the stop time. 6. If you do not feel 10 movements in 2 hours, contact your health care provider for further instructions. Your health care provider may want to do additional tests to assess your baby's well-being. Contact a health care provider if:  You feel fewer than 10 movements in 2 hours.  Your baby is not moving like he or she usually does. Date: ____________ Start time: ____________ Stop time: ____________ Movements: ____________ Date: ____________ Start time: ____________ Stop time: ____________ Movements: ____________ Date: ____________  Start time: ____________ Stop time: ____________ Movements: ____________ Date: ____________ Start time: ____________ Stop time: ____________ Movements: ____________ Date: ____________ Start time: ____________ Stop time: ____________ Movements: ____________ Date: ____________ Start time: ____________ Stop time: ____________ Movements: ____________ Date: ____________ Start time: ____________ Stop time: ____________ Movements: ____________ Date: ____________ Start time: ____________ Stop time: ____________ Movements: ____________ Date: ____________ Start time: ____________ Stop time: ____________ Movements: ____________ This information is not intended to replace advice given to you by your health care provider. Make sure you discuss any questions you have with your health care provider. Document Revised: 07/12/2019 Document Reviewed: 07/12/2019 Elsevier Patient Education  2021 Elsevier Inc. Rosen's Emergency Medicine: Concepts and Clinical Practice (9th ed., pp. 2296- 2312). Elsevier.">    Braxton Hicks Contractions Contractions of the uterus can occur throughout pregnancy, but they are not always a sign that you are in labor. You may have practice contractions called Braxton Hicks contractions. These false labor contractions are sometimes confused with true labor. What are Braxton Hicks contractions? Braxton Hicks contractions are tightening movements that occur in the muscles of the uterus before labor. Unlike true labor contractions, these contractions do not result in opening (dilation) and thinning of the cervix. Toward the end of pregnancy (32-34 weeks), Braxton Hicks contractions can happen more often and may become stronger. These contractions are sometimes difficult to tell apart from true labor because they can be very uncomfortable. You should not feel embarrassed if you go to the hospital with false labor. Sometimes, the only way to tell if you are in true labor is   for your health care  provider to look for changes in the cervix. The health care provider will do a physical exam and may monitor your contractions. If you are not in true labor, the exam should show that your cervix is not dilating and your water has not broken. If there are no other health problems associated with your pregnancy, it is completely safe for you to be sent home with false labor. You may continue to have Braxton Hicks contractions until you go into true labor. How to tell the difference between true labor and false labor True labor  Contractions last 30-70 seconds.  Contractions become very regular.  Discomfort is usually felt in the top of the uterus, and it spreads to the lower abdomen and low back.  Contractions do not go away with walking.  Contractions usually become more intense and increase in frequency.  The cervix dilates and gets thinner. False labor  Contractions are usually shorter and not as strong as true labor contractions.  Contractions are usually irregular.  Contractions are often felt in the front of the lower abdomen and in the groin.  Contractions may go away when you walk around or change positions while lying down.  Contractions get weaker and are shorter-lasting as time goes on.  The cervix usually does not dilate or become thin. Follow these instructions at home:  Take over-the-counter and prescription medicines only as told by your health care provider.  Keep up with your usual exercises and follow other instructions from your health care provider.  Eat and drink lightly if you think you are going into labor.  If Braxton Hicks contractions are making you uncomfortable: ? Change your position from lying down or resting to walking, or change from walking to resting. ? Sit and rest in a tub of warm water. ? Drink enough fluid to keep your urine pale yellow. Dehydration may cause these contractions. ? Do slow and deep breathing several times an hour.  Keep  all follow-up prenatal visits as told by your health care provider. This is important.   Contact a health care provider if:  You have a fever.  You have continuous pain in your abdomen. Get help right away if:  Your contractions become stronger, more regular, and closer together.  You have fluid leaking or gushing from your vagina.  You pass blood-tinged mucus (bloody show).  You have bleeding from your vagina.  You have low back pain that you never had before.  You feel your baby's head pushing down and causing pelvic pressure.  Your baby is not moving inside you as much as it used to. Summary  Contractions that occur before labor are called Braxton Hicks contractions, false labor, or practice contractions.  Braxton Hicks contractions are usually shorter, weaker, farther apart, and less regular than true labor contractions. True labor contractions usually become progressively stronger and regular, and they become more frequent.  Manage discomfort from Braxton Hicks contractions by changing position, resting in a warm bath, drinking plenty of water, or practicing deep breathing. This information is not intended to replace advice given to you by your health care provider. Make sure you discuss any questions you have with your health care provider. Document Revised: 11/04/2017 Document Reviewed: 04/07/2017 Elsevier Patient Education  2021 Elsevier Inc.  

## 2021-04-03 ENCOUNTER — Telehealth: Payer: Self-pay | Admitting: Family Medicine

## 2021-04-03 NOTE — Telephone Encounter (Signed)
Pt states she needs someone to call her back to reschedule her induction to 04-11-21 or 04-12-21, pt states she is not available for the time that is scheduled

## 2021-04-03 NOTE — Telephone Encounter (Signed)
I returned call to pt and she informed me that she cannot find childcare for induction on 5/3.  She requested to have IOL moved back to 5/6 @ midnight as previously scheduled (arrival @ 11:45 pm on 5/5). I advised pt that I will try to reschedule the IOL appointment. She may view the new appt via Mychart. Pt voiced understanding and expressed gratitude.

## 2021-04-06 ENCOUNTER — Telehealth (HOSPITAL_COMMUNITY): Payer: Self-pay | Admitting: *Deleted

## 2021-04-06 ENCOUNTER — Other Ambulatory Visit: Payer: Self-pay | Admitting: Advanced Practice Midwife

## 2021-04-06 ENCOUNTER — Other Ambulatory Visit (HOSPITAL_COMMUNITY): Payer: Medicaid Other

## 2021-04-06 NOTE — Telephone Encounter (Signed)
Preadmission screen  

## 2021-04-07 ENCOUNTER — Inpatient Hospital Stay (HOSPITAL_COMMUNITY)
Admission: AD | Admit: 2021-04-07 | Payer: Medicaid Other | Source: Home / Self Care | Admitting: Obstetrics & Gynecology

## 2021-04-07 ENCOUNTER — Inpatient Hospital Stay (HOSPITAL_COMMUNITY): Payer: Medicaid Other

## 2021-04-08 ENCOUNTER — Other Ambulatory Visit (HOSPITAL_COMMUNITY): Payer: Medicaid Other

## 2021-04-09 ENCOUNTER — Other Ambulatory Visit (HOSPITAL_COMMUNITY)
Admission: RE | Admit: 2021-04-09 | Discharge: 2021-04-09 | Disposition: A | Discharge status: Home / Self Care | Payer: Medicaid Other | Source: Ambulatory Visit | Attending: Obstetrics & Gynecology | Admitting: Obstetrics & Gynecology

## 2021-04-09 DIAGNOSIS — Z01812 Encounter for preprocedural laboratory examination: Secondary | ICD-10-CM | POA: Insufficient documentation

## 2021-04-09 DIAGNOSIS — Z20822 Contact with and (suspected) exposure to covid-19: Secondary | ICD-10-CM | POA: Insufficient documentation

## 2021-04-10 ENCOUNTER — Inpatient Hospital Stay (HOSPITAL_COMMUNITY): Payer: Medicaid Other

## 2021-04-10 LAB — SARS CORONAVIRUS 2 (TAT 6-24 HRS): SARS Coronavirus 2: NEGATIVE

## 2021-04-11 ENCOUNTER — Inpatient Hospital Stay (HOSPITAL_COMMUNITY): Payer: Medicaid Other

## 2021-04-11 ENCOUNTER — Other Ambulatory Visit: Payer: Self-pay

## 2021-04-11 ENCOUNTER — Inpatient Hospital Stay (HOSPITAL_COMMUNITY)
Admission: AD | Admit: 2021-04-11 | Discharge: 2021-04-13 | DRG: 806 | Disposition: A | Discharge status: Home / Self Care | Payer: Medicaid Other | Attending: Obstetrics & Gynecology | Admitting: Obstetrics & Gynecology

## 2021-04-11 ENCOUNTER — Other Ambulatory Visit: Payer: Self-pay | Admitting: Advanced Practice Midwife

## 2021-04-11 ENCOUNTER — Encounter (HOSPITAL_COMMUNITY): Payer: Self-pay | Admitting: Family Medicine

## 2021-04-11 DIAGNOSIS — Z3A38 38 weeks gestation of pregnancy: Secondary | ICD-10-CM

## 2021-04-11 DIAGNOSIS — F331 Major depressive disorder, recurrent, moderate: Secondary | ICD-10-CM | POA: Diagnosis present

## 2021-04-11 DIAGNOSIS — O1002 Pre-existing essential hypertension complicating childbirth: Principal | ICD-10-CM | POA: Diagnosis present

## 2021-04-11 DIAGNOSIS — O09299 Supervision of pregnancy with other poor reproductive or obstetric history, unspecified trimester: Secondary | ICD-10-CM

## 2021-04-11 DIAGNOSIS — O99344 Other mental disorders complicating childbirth: Secondary | ICD-10-CM | POA: Diagnosis present

## 2021-04-11 DIAGNOSIS — O09899 Supervision of other high risk pregnancies, unspecified trimester: Secondary | ICD-10-CM

## 2021-04-11 DIAGNOSIS — F419 Anxiety disorder, unspecified: Secondary | ICD-10-CM | POA: Diagnosis present

## 2021-04-11 DIAGNOSIS — Z880 Allergy status to penicillin: Secondary | ICD-10-CM

## 2021-04-11 DIAGNOSIS — Z6841 Body Mass Index (BMI) 40.0 and over, adult: Secondary | ICD-10-CM

## 2021-04-11 DIAGNOSIS — O0990 Supervision of high risk pregnancy, unspecified, unspecified trimester: Secondary | ICD-10-CM

## 2021-04-11 DIAGNOSIS — O1092 Unspecified pre-existing hypertension complicating childbirth: Secondary | ICD-10-CM

## 2021-04-11 DIAGNOSIS — O10919 Unspecified pre-existing hypertension complicating pregnancy, unspecified trimester: Secondary | ICD-10-CM

## 2021-04-11 LAB — CBC
HCT: 34.5 % — ABNORMAL LOW (ref 36.0–46.0)
Hemoglobin: 11.5 g/dL — ABNORMAL LOW (ref 12.0–15.0)
MCH: 29.6 pg (ref 26.0–34.0)
MCHC: 33.3 g/dL (ref 30.0–36.0)
MCV: 88.7 fL (ref 80.0–100.0)
Platelets: 223 10*3/uL (ref 150–400)
RBC: 3.89 MIL/uL (ref 3.87–5.11)
RDW: 14.7 % (ref 11.5–15.5)
WBC: 8.9 10*3/uL (ref 4.0–10.5)
nRBC: 0 % (ref 0.0–0.2)

## 2021-04-11 LAB — COMPREHENSIVE METABOLIC PANEL
ALT: 21 U/L (ref 0–44)
AST: 15 U/L (ref 15–41)
Albumin: 2.8 g/dL — ABNORMAL LOW (ref 3.5–5.0)
Alkaline Phosphatase: 87 U/L (ref 38–126)
Anion gap: 9 (ref 5–15)
BUN: 6 mg/dL (ref 6–20)
CO2: 21 mmol/L — ABNORMAL LOW (ref 22–32)
Calcium: 8.6 mg/dL — ABNORMAL LOW (ref 8.9–10.3)
Chloride: 104 mmol/L (ref 98–111)
Creatinine, Ser: 0.56 mg/dL (ref 0.44–1.00)
GFR, Estimated: >60 mL/min (ref >60)
Glucose, Bld: 92 mg/dL (ref 70–99)
Potassium: 3.6 mmol/L (ref 3.5–5.1)
Sodium: 134 mmol/L — ABNORMAL LOW (ref 135–145)
Total Bilirubin: 0.3 mg/dL (ref 0.3–1.2)
Total Protein: 6.9 g/dL (ref 6.5–8.1)

## 2021-04-11 LAB — TYPE AND SCREEN
ABO/RH(D): O POS
Antibody Screen: NEGATIVE

## 2021-04-11 LAB — PROTEIN / CREATININE RATIO, URINE
Creatinine, Urine: 115.32 mg/dL
Protein Creatinine Ratio: 0.1 mg/mg{Cre} (ref 0.00–0.15)
Total Protein, Urine: 12 mg/dL

## 2021-04-11 LAB — RPR: RPR Ser Ql: NONREACTIVE

## 2021-04-11 MED ORDER — ACETAMINOPHEN 325 MG PO TABS: 650.0000 mg | ORAL_TABLET | ORAL | Status: DC | PRN

## 2021-04-11 MED ORDER — COCONUT OIL OIL: 1.0000 "application " | TOPICAL_OIL | Status: DC | PRN

## 2021-04-11 MED ORDER — LEVONORGESTREL 20.1 MCG/DAY IU IUD
1.0000 | INTRAUTERINE_SYSTEM | Freq: Once | INTRAUTERINE | Status: DC
  Filled 2021-04-11: qty 1

## 2021-04-11 MED ORDER — BENZOCAINE-MENTHOL 20-0.5 % EX AERO: 1.0000 "application " | INHALATION_SPRAY | CUTANEOUS | Status: DC | PRN

## 2021-04-11 MED ORDER — DIPHENHYDRAMINE HCL 25 MG PO CAPS: 25.0000 mg | ORAL_CAPSULE | Freq: Four times a day (QID) | ORAL | Status: DC | PRN

## 2021-04-11 MED ORDER — ACETAMINOPHEN 325 MG PO TABS
650.0000 mg | ORAL_TABLET | ORAL | Status: DC | PRN
  Administered 2021-04-12 – 2021-04-13 (×6): 650 mg via ORAL
  Filled 2021-04-11 (×6): qty 2

## 2021-04-11 MED ORDER — FENTANYL CITRATE (PF) 100 MCG/2ML IJ SOLN
INTRAMUSCULAR | Status: AC
  Administered 2021-04-11: 100 ug via INTRAVENOUS
  Filled 2021-04-11: qty 2

## 2021-04-11 MED ORDER — ONDANSETRON HCL 4 MG/2ML IJ SOLN: 4.0000 mg | Freq: Four times a day (QID) | INTRAMUSCULAR | Status: DC | PRN

## 2021-04-11 MED ORDER — NIFEDIPINE ER OSMOTIC RELEASE 30 MG PO TB24
60.0000 mg | ORAL_TABLET | Freq: Every day | ORAL | Status: DC
  Administered 2021-04-11 – 2021-04-13 (×3): 60 mg via ORAL
  Filled 2021-04-11: qty 1
  Filled 2021-04-11 (×2): qty 2

## 2021-04-11 MED ORDER — METHYLERGONOVINE MALEATE 0.2 MG/ML IJ SOLN
INTRAMUSCULAR | Status: AC
  Filled 2021-04-11: qty 1

## 2021-04-11 MED ORDER — MISOPROSTOL 25 MCG QUARTER TABLET
25.0000 ug | ORAL_TABLET | ORAL | Status: DC | PRN
  Administered 2021-04-11 (×2): 25 ug via VAGINAL
  Filled 2021-04-11 (×2): qty 1

## 2021-04-11 MED ORDER — SOD CITRATE-CITRIC ACID 500-334 MG/5ML PO SOLN: 30.0000 mL | ORAL | Status: DC | PRN

## 2021-04-11 MED ORDER — ONDANSETRON HCL 4 MG/2ML IJ SOLN: 4.0000 mg | INTRAMUSCULAR | Status: DC | PRN

## 2021-04-11 MED ORDER — TERBUTALINE SULFATE 1 MG/ML IJ SOLN: 0.2500 mg | Freq: Once | INTRAMUSCULAR | Status: DC | PRN

## 2021-04-11 MED ORDER — TRANEXAMIC ACID-NACL 1000-0.7 MG/100ML-% IV SOLN: 1000.0000 mg | INTRAVENOUS | Status: AC

## 2021-04-11 MED ORDER — IBUPROFEN 600 MG PO TABS
600.0000 mg | ORAL_TABLET | Freq: Four times a day (QID) | ORAL | Status: DC
  Administered 2021-04-12 – 2021-04-13 (×7): 600 mg via ORAL
  Filled 2021-04-11 (×7): qty 1

## 2021-04-11 MED ORDER — PRENATAL MULTIVITAMIN CH
1.0000 | ORAL_TABLET | Freq: Every day | ORAL | Status: DC
  Administered 2021-04-12 – 2021-04-13 (×2): 1 via ORAL
  Filled 2021-04-11 (×2): qty 1

## 2021-04-11 MED ORDER — LIDOCAINE HCL (PF) 1 % IJ SOLN: 30.0000 mL | INTRAMUSCULAR | Status: DC | PRN

## 2021-04-11 MED ORDER — SERTRALINE HCL 100 MG PO TABS
100.0000 mg | ORAL_TABLET | Freq: Every day | ORAL | Status: DC
  Administered 2021-04-11 – 2021-04-13 (×3): 100 mg via ORAL
  Filled 2021-04-11 (×3): qty 1

## 2021-04-11 MED ORDER — LACTATED RINGERS IV SOLN
INTRAVENOUS | Status: DC
  Administered 2021-04-11 (×2): 125 mL/h via INTRAVENOUS

## 2021-04-11 MED ORDER — SENNOSIDES-DOCUSATE SODIUM 8.6-50 MG PO TABS
2.0000 | ORAL_TABLET | ORAL | Status: DC
  Administered 2021-04-12: 2 via ORAL
  Filled 2021-04-11: qty 2

## 2021-04-11 MED ORDER — OXYTOCIN BOLUS FROM INFUSION
333.0000 mL | Freq: Once | INTRAVENOUS | Status: AC
  Administered 2021-04-11: 333 mL via INTRAVENOUS

## 2021-04-11 MED ORDER — TETANUS-DIPHTH-ACELL PERTUSSIS 5-2.5-18.5 LF-MCG/0.5 IM SUSY: 0.5000 mL | PREFILLED_SYRINGE | Freq: Once | INTRAMUSCULAR | Status: DC

## 2021-04-11 MED ORDER — FLEET ENEMA 7-19 GM/118ML RE ENEM: 1.0000 | ENEMA | RECTAL | Status: DC | PRN

## 2021-04-11 MED ORDER — MISOPROSTOL 200 MCG PO TABS
ORAL_TABLET | ORAL | Status: AC
  Filled 2021-04-11: qty 5

## 2021-04-11 MED ORDER — WITCH HAZEL-GLYCERIN EX PADS: 1.0000 "application " | MEDICATED_PAD | CUTANEOUS | Status: DC | PRN

## 2021-04-11 MED ORDER — FENTANYL CITRATE (PF) 100 MCG/2ML IJ SOLN
100.0000 ug | INTRAMUSCULAR | Status: DC | PRN
  Administered 2021-04-11 (×4): 100 ug via INTRAVENOUS
  Filled 2021-04-11 (×4): qty 2

## 2021-04-11 MED ORDER — SIMETHICONE 80 MG PO CHEW
80.0000 mg | CHEWABLE_TABLET | ORAL | Status: DC | PRN
  Filled 2021-04-11: qty 1

## 2021-04-11 MED ORDER — MISOPROSTOL 200 MCG PO TABS
1000.0000 ug | ORAL_TABLET | Freq: Once | ORAL | Status: AC
  Administered 2021-04-11: 1000 ug via RECTAL

## 2021-04-11 MED ORDER — OXYCODONE-ACETAMINOPHEN 5-325 MG PO TABS: 1.0000 | ORAL_TABLET | ORAL | Status: DC | PRN

## 2021-04-11 MED ORDER — TRANEXAMIC ACID-NACL 1000-0.7 MG/100ML-% IV SOLN
INTRAVENOUS | Status: AC
  Administered 2021-04-11: 1000 mg via INTRAVENOUS
  Filled 2021-04-11: qty 100

## 2021-04-11 MED ORDER — LACTATED RINGERS IV SOLN: 500.0000 mL | INTRAVENOUS | Status: DC | PRN

## 2021-04-11 MED ORDER — DIBUCAINE (PERIANAL) 1 % EX OINT: 1.0000 "application " | TOPICAL_OINTMENT | CUTANEOUS | Status: DC | PRN

## 2021-04-11 MED ORDER — OXYTOCIN-SODIUM CHLORIDE 30-0.9 UT/500ML-% IV SOLN
2.5000 [IU]/h | INTRAVENOUS | Status: DC
  Filled 2021-04-11: qty 500

## 2021-04-11 MED ORDER — DOCUSATE SODIUM 100 MG PO CAPS
100.0000 mg | ORAL_CAPSULE | Freq: Two times a day (BID) | ORAL | Status: DC
  Administered 2021-04-12 – 2021-04-13 (×4): 100 mg via ORAL
  Filled 2021-04-11 (×4): qty 1

## 2021-04-11 MED ORDER — OXYCODONE-ACETAMINOPHEN 5-325 MG PO TABS: 2.0000 | ORAL_TABLET | ORAL | Status: DC | PRN

## 2021-04-11 MED ORDER — ONDANSETRON HCL 4 MG PO TABS: 4.0000 mg | ORAL_TABLET | ORAL | Status: DC | PRN

## 2021-04-11 NOTE — Progress Notes (Signed)
Patient ID: Jill Mullen, female   DOB: 05-17-1986, 35 y.o.   MRN: [id number]  Just received 2nd cytotec dose; feeling more crampy; with previous IOL had vag cytotec x 2 followed by 1 dose of buccal which put her into labor>is hoping for something similar this time  BPs 109/44, 101/57 FHR 130s, +accels, no decels Ctx q 2-4 mins Cx per RN: 3/soft/vtx -2  IUP@38 .4wks cHTN BMI 50 Cx semi-favorable  Trying to avoid Pit per pt preference- will keep going with cytotec until AROM is an option Anticipate vag del  Arabella Merles CNM 04/11/2021 7:50 AM

## 2021-04-11 NOTE — Progress Notes (Signed)
Labor Progress Note Jill Mullen is a 35 y.o. [id number] at [redacted]w[redacted]d presented for IOL-cHTN. S: Doing well without complaints.  O:  BP (!) 101/57   Pulse 88   Temp 98.5 F (36.9 C) (Oral)   Resp 16   Ht 5\' 9"  (1.753 m)   Wt (!) 152.4 kg   LMP 06/29/2020 (Within Days)   BMI 49.60 kg/m  EFM: baseline 130bpm/mod variability/+accels/no decels Toco: q1-5 min  CVE: Dilation: 5 Effacement (%): 50 Station: -2 Presentation: Vertex Exam by:: MD [email or web address]   A&P: 35 y.o. 32 [redacted]w[redacted]d presented for IOL-cHTN. #IOL: S/p cytotec x2. Patient still laboring off of prior cytotec and would like to defer pitocin. She has made good cervical change since her last exam so we discussed we will recheck her in 4 hours and AROM if able. Patient amendable to plan. #Pain: PRN #FWB: cat 1 #GBS negative #cHTN: asymptomatic, preE labs nml. BP stable. Continue to monitor. #BMI 50 #Contraception: post placental liletta ordered and consented previously.  [redacted]w[redacted]d, MD 12:36 PM

## 2021-04-11 NOTE — Discharge Summary (Signed)
Postpartum Discharge Summary    Patient Name: Fiza Nation DOB: November 30, 1986 MRN: [id number]  Date of admission: 04/11/2021 Delivery date:04/11/2021  Delivering provider: Janet Berlin  Date of discharge: 04/13/2021  Admitting diagnosis: Chronic hypertension affecting pregnancy [O10.919] Intrauterine pregnancy: [redacted]w[redacted]d     Secondary diagnosis:  Active Problems:   Chronic hypertension affecting pregnancy   BMI 45.0-49.9, adult ([place])   Hx of preeclampsia, prior pregnancy, currently pregnant   Major depressive disorder, recurrent episode, moderate with anxious distress ([place])   Vaginal delivery  Additional problems: as noted above  Discharge diagnosis: Term Pregnancy Delivered and CHTN                                              Post partum procedures:none Augmentation: Cytotec Complications: None  Hospital course: Induction of Labor With Vaginal Delivery   35 y.o. yo [phone] at [redacted]w[redacted]d was admitted to the hospital 04/11/2021 for induction of labor.  Indication for induction: Chronic HTN.  Patient had an uncomplicated labor course as follows: Membrane Rupture Time/Date: 8:54 PM ,04/11/2021   Delivery Method:Vaginal, Spontaneous  Episiotomy: None  Lacerations:  None  Details of delivery can be found in separate delivery note.  Patient had a routine postpartum course. Patient is discharged home 04/13/21. Pt discharged on procardia $RemoveBefo'60mg'JpuRBbOolIx$  daily given cHTN with plan for 1 week blood pressure check in clinic.  Newborn Data: Birth date:04/11/2021  Birth time:8:58 PM  Gender:Female  Living status:Living  Apgars:9 ,9  Weight:3291 g   Magnesium Sulfate received: No BMZ received: No Rhophylac:N/A MMR:N/A T-DaP:offered prior to discharge Flu: No Transfusion:No  Physical exam  Vitals:   04/12/21 0025 04/12/21 0522 04/12/21 1425 04/12/21 2305  BP: 135/75 122/71 116/75 126/65  Pulse: 82 70 82 80  Resp: $Remo'18 18  18  'SDKMe$ Temp: 98 F (36.7 C) 98 F (36.7 C) 98.1 F (36.7 C) 98 F (36.7 C)   TempSrc: Oral Oral Oral Oral  SpO2: 99%   100%  Weight:      Height:       General: alert, cooperative and no distress Lochia: appropriate Uterine Fundus: firm Incision: N/A DVT Evaluation: No evidence of DVT seen on physical exam. No cords or calf tenderness. No significant calf/ankle edema. Labs: Lab Results  Component Value Date   WBC 8.9 04/11/2021   HGB 11.5 (L) 04/11/2021   HCT 34.5 (L) 04/11/2021   MCV 88.7 04/11/2021   PLT 223 04/11/2021   CMP Latest Ref Rng & Units 04/11/2021  Glucose 70 - 99 mg/dL 92  BUN 6 - 20 mg/dL 6  Creatinine 0.44 - 1.00 mg/dL 0.56  Sodium 135 - 145 mmol/L 134(L)  Potassium 3.5 - 5.1 mmol/L 3.6  Chloride 98 - 111 mmol/L 104  CO2 22 - 32 mmol/L 21(L)  Calcium 8.9 - 10.3 mg/dL 8.6(L)  Total Protein 6.5 - 8.1 g/dL 6.9  Total Bilirubin 0.3 - 1.2 mg/dL 0.3  Alkaline Phos 38 - 126 U/L 87  AST 15 - 41 U/L 15  ALT 0 - 44 U/L 21   Edinburgh Score: Edinburgh Postnatal Depression Scale Screening Tool 04/11/2021  I have been able to laugh and see the funny side of things. (No Data)  I have looked forward with enjoyment to things. -  I have blamed myself unnecessarily when things went wrong. -  I have been anxious or worried for no  good reason. -  I have felt scared or panicky for no good reason. -  Things have been getting on top of me. -  I have been so unhappy that I have had difficulty sleeping. -  I have felt sad or miserable. -  I have been so unhappy that I have been crying. -  The thought of harming myself has occurred to me. Flavia Shipper Postnatal Depression Scale Total -     After visit meds:  Allergies as of 04/13/2021      Reactions   Penicillins Anaphylaxis, Hives      Medication List    STOP taking these medications   aspirin 81 MG chewable tablet   promethazine 25 MG tablet Commonly known as: PHENERGAN     TAKE these medications   acetaminophen 500 MG tablet Commonly known as: TYLENOL Take 2 tablets (1,000 mg total)  by mouth every 6 (six) hours.   Blood Pressure Kit Devi 1 Device by Does not apply route as needed.   coconut oil Oil Apply 1 application topically as needed (nipple pain).   docusate sodium 100 MG capsule Commonly known as: COLACE Take 1 capsule (100 mg total) by mouth 2 (two) times daily.   ibuprofen 600 MG tablet Commonly known as: ADVIL Take 1 tablet (600 mg total) by mouth every 8 (eight) hours as needed for moderate pain or cramping.   NIFEdipine 60 MG 24 hr tablet Commonly known as: Procardia XL Take 1 tablet (60 mg total) by mouth daily.   omeprazole 40 MG capsule Commonly known as: PRILOSEC Take 1 capsule (40 mg total) by mouth daily.   oxyCODONE 5 MG immediate release tablet Commonly known as: Oxy IR/ROXICODONE Take 1 tablet (5 mg total) by mouth every 6 (six) hours as needed for severe pain or breakthrough pain.   Prenatal Vitamins 28-0.8 MG Tabs Take 1 tablet by mouth daily.   sertraline 100 MG tablet Commonly known as: Zoloft Take 1 tablet (100 mg total) by mouth daily.   traZODone 50 MG tablet Commonly known as: DESYREL Take 1 tablet (50 mg total) by mouth at bedtime as needed for sleep.        Discharge home in stable condition Infant Feeding: Breast Infant Disposition:home with mother Discharge instruction: per After Visit Summary and Postpartum booklet. Activity: Advance as tolerated. Pelvic rest for 6 weeks.  Diet: routine diet Future Appointments: Future Appointments  Date Time Provider [place]  05/19/2021  3:00 PM Cozart, Birdena Jubilee, LCSW [facility] None  06/04/2021  2:00 PM Cozart, Birdena Jubilee, LCSW [facility] None  06/30/2021 11:00 AM Salley Slaughter, NP [facility] None   Follow up Visit:  Please schedule this patient for a In person postpartum visit in 6 weeks with the following provider: Any provider. Additional Postpartum F/U:BP check 1 week; IUD and pap at postpartum visit  High risk pregnancy complicated by: HTN Delivery mode:   Vaginal, Spontaneous  Anticipated Birth Control: outpatient IUD  Avel Ogawa, Gildardo Cranker, MD OB Fellow, Faculty Practice 04/13/2021 6:18 AM

## 2021-04-11 NOTE — Progress Notes (Signed)
RN attempting to locate fhr since 1447

## 2021-04-11 NOTE — Progress Notes (Signed)
Labor Progress Note [name] is a 35 y.o. [id number] at [redacted]w[redacted]d presented for IOL-cHTN. S: Doing well without complaints.  O:  BP 109/61   Pulse 83   Temp 98.6 F (37 C) (Oral)   Resp 20   Ht 5\' 9"  (1.753 m)   Wt (!) 152.4 kg   LMP 06/29/2020 (Within Days)   BMI 49.60 kg/m  EFM: baseline 140bpm/mod variability/+accels/no decels Toco: q1-5 min  CVE: Dilation: 7 Effacement (%): 90 Station: -1 Presentation: Vertex Exam by:: MD [email or web address]   A&P: 35 y.o. 32 [redacted]w[redacted]d presented for IOL-cHTN. #IOL: S/p cytotec x2. Patient has made slow cervical change throughout the day, now 7cm. Discussed AROM, patient declines. Given she is making change will continue expectant management. Patient would like to avoid pitocin. #Pain: PRN #FWB: cat 1 #GBS negative #cHTN: asymptomatic, preE labs nml. BP stable. Continue to monitor. #BMI 50 #Contraception: post placental liletta ordered and consented previously.  [redacted]w[redacted]d, MD 6:09 PM

## 2021-04-11 NOTE — Progress Notes (Signed)
Unable to locate FHR for approx 20 minutes.  Plan was to AROM and place IFSE.  Pt refused both.  Explained to pt that with inability to accurately trace FHR we are unable to assess infants well being.  Pt still refused.  We asked pt to reconsider placing IFSE and AROM

## 2021-04-11 NOTE — Progress Notes (Signed)
Called by RN to report to bedside due to difficulty obtaining FHT. Due to patient's body habitus RN having difficulty. Upon arrival cervical exam 6/90/-2 with bulging bag. Discussed risks/benefits of AROM with patient given she has not had any augmentation since 0730. She has made slow cervical change throughout the day. Discussed fetal scalp electrode to more closely monitor baby. Patient declines AROM/FSE at this time. Will continue to think about this. Discussed that if RN continues to have difficulty obtaining FHT would strongly recommend FSE placement. Will recheck in 2 hours and AROM if unchanged and patient amendable. VSS. Continue to monitor.  Alric Seton, MD OB Fellow, Faculty [facility], Center for [facility] 04/11/2021 3:16 PM

## 2021-04-11 NOTE — H&P (Addendum)
OBSTETRIC ADMISSION HISTORY AND PHYSICAL  Jill Mullen is a 35 y.o. female [phone] with IUP at 26w4dby 16 wk [place] for IOL for Chronic Hypertension. She reports +FMs, No LOF, no VB, no blurry vision, headaches or peripheral edema, and RUQ pain.  She plans on breast feeding. She request Post-placental IUD for birth control. [facility]  Dating: By 16 wk [place]--->  Estimated Date of Delivery: 04/21/21  Sono:    _0 , CWD, normal anatomy, Cephalic presentation, Posterior Fundal lie, 3064g, 76% EFW   Prenatal History/Complications: Chronic HTN, Anxiety/Depression, Hx of Pre-Term Delivery, Hx of Pre-E  Past Medical History: Past Medical History:  Diagnosis Date  . History of gestational hypertension   . Hypertension   . Pregnancy induced hypertension   . Preterm labor   . Vaginal Pap smear, abnormal 2005    Past Surgical History: Past Surgical History:  Procedure Laterality Date  . TONSILLECTOMY      Obstetrical History: OB History    Gravida  5   Para  3   Term  2   Preterm  1   AB  1   Living  3     SAB  1   IAB      Ectopic      Multiple  0   Live Births  3           Social History Social History   Socioeconomic History  . Marital status: Married    Spouse name: Dru  . Number of children: Not on file  . Years of education: Not on file  . Highest education level: Not on file  Occupational History  . Not on file  Tobacco Use  . Smoking status: Never Smoker  . Smokeless tobacco: Never Used  Vaping Use  . Vaping Use: Never used  Substance and Sexual Activity  . Alcohol use: Not Currently    Comment: rarely  . Drug use: Yes    Types: Marijuana    Comment: seldom  . Sexual activity: Yes    Birth control/protection: None  Other Topics Concern  . Not on file  Social History Narrative  . Not on file   Social Determinants of Health   Financial Resource Strain: Not on file  Food Insecurity: No  Food Insecurity  . Worried About RCharity fundraiserin the Last Year: Never true  . Ran Out of Food in the Last Year: Never true  Transportation Needs: No Transportation Needs  . Lack of Transportation (Medical): No  . Lack of Transportation (Non-Medical): No  Physical Activity: Not on file  Stress: Not on file  Social Connections: Not on file    Family History: Family History  Problem Relation Age of Onset  . Depression Mother   . Obesity Mother   . Drug abuse Father   . Obesity Father   . ADD / ADHD Sister   . ADD / ADHD Brother   . ADD / ADHD Daughter   . Diabetes Daughter   . Intellectual disability Daughter   . Learning disabilities Daughter   . ADD / ADHD Son   . Arthritis Maternal Grandmother   . Cancer Maternal Grandmother   . Cancer Maternal Grandfather     Allergies: Allergies  Allergen Reactions  . Penicillins Anaphylaxis and Hives    Medications Prior to Admission  Medication Sig Dispense Refill Last Dose  . aspirin 81 MG chewable tablet Chew 1 tablet (  81 mg total) by mouth daily. 30 tablet 7 04/10/2021 at Unknown time  . NIFEdipine (PROCARDIA XL) 60 MG 24 hr tablet Take 1 tablet (60 mg total) by mouth daily. 30 tablet 2 04/10/2021 at Unknown time  . omeprazole (PRILOSEC) 40 MG capsule Take 1 capsule (40 mg total) by mouth daily. 30 capsule 3 04/10/2021 at Unknown time  . Prenatal Vit-Fe Fumarate-FA (PRENATAL VITAMINS) 28-0.8 MG TABS Take 1 tablet by mouth daily.   04/10/2021 at Unknown time  . promethazine (PHENERGAN) 25 MG tablet Take 1 tablet (25 mg total) by mouth every 6 (six) hours as needed for nausea or vomiting. 30 tablet 1 04/10/2021 at Unknown time  . sertraline (ZOLOFT) 100 MG tablet Take 1 tablet (100 mg total) by mouth daily. 30 tablet 2 04/10/2021 at Unknown time  . traZODone (DESYREL) 50 MG tablet Take 1 tablet (50 mg total) by mouth at bedtime as needed for sleep. 30 tablet 2 04/10/2021 at Unknown time  . Blood Pressure Monitoring (BLOOD PRESSURE KIT) DEVI  1 Device by Does not apply route as needed. (Patient not taking: Reported on 04/02/2021) 1 each 0      Review of Systems   All systems reviewed and negative except as stated in HPI  Blood pressure (!) 109/44, pulse 87, temperature 98.6 F (37 C), temperature source Oral, resp. rate 18, height _0  (1.753 m), weight (!) 152.4 kg, last menstrual period 06/29/2020, unknown if currently breastfeeding. General appearance: alert and cooperative Lungs: breathing comfortably Heart: regular rate and rhythm Abdomen: soft, non-tender; bowel sounds normal Presentation: cephalic Fetal monitoringBaseline: 145 bpm, Variability: Good {> 6 bpm), Accelerations: Absent and Decelerations: Absent Uterine activityFrequency: Every 3 minutes per patient, unable to visualize Dilation: 2 Effacement (%): 40 Station: -3,-2 Exam by:: Petra Kuba, RN   Prenatal labs: ABO, Rh: --/--/O POS (05/07 0245) Antibody: NEG (05/07 0245) Rubella: 1.30 (11/17 1507) RPR: Non Reactive (11/17 1507)  HBsAg: Negative (11/17 1507)  HIV: Non Reactive (11/17 1507)  GBS: Negative/-- (04/20 1033)  1 hr Glucola Not obtained Genetic screening  Not obtained Anatomy US Normal  Prenatal Transfer Tool  Maternal Diabetes: Not obtained Genetic Screening: Declined Maternal Ultrasounds/Referrals: Normal Fetal Ultrasounds or other Referrals:  None Maternal Substance Abuse:  No Significant Maternal Medications:  Meds include: Other:  Zoloft, ProCardia Significant Maternal Lab Results: Group B Strep negative  Results for orders placed or performed during the hospital encounter of 04/11/21 (from the past 24 hour(s))  CBC   Collection Time: 04/11/21  2:45 AM  Result Value Ref Range   WBC 8.9 4.0 - 10.5 K/uL   RBC 3.89 3.87 - 5.11 MIL/uL   Hemoglobin 11.5 (L) 12.0 - 15.0 g/dL   HCT 34.5 (L) 36.0 - 46.0 %   MCV 88.7 80.0 - 100.0 fL   MCH 29.6 26.0 - 34.0 pg   MCHC 33.3 30.0 - 36.0 g/dL   RDW 14.7 11.5 - 15.5 %   Platelets 223  150 - 400 K/uL   nRBC 0.0 0.0 - 0.2 %  Type and screen   Collection Time: 04/11/21  2:45 AM  Result Value Ref Range   ABO/RH(D) O POS    Antibody Screen NEG    Sample Expiration      04/14/2021,2359 Performed at [facility], [place] [address], [place], [place] [address]     Patient Active Problem List   Diagnosis Date Noted  . Major depressive disorder, recurrent episode, moderate with anxious distress ([place]) 03/27/2021  .  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 Spotting and cramping affecting pregnancy, antepartum 12/30/2020  . Anxiety and depression 12/17/2020  . BMI 45.0-49.9, adult ([place]) 10/22/2020  . Hx of preeclampsia, prior pregnancy, currently pregnant 10/22/2020  . Nausea and vomiting during pregnancy 10/22/2020  . Supervision of high risk pregnancy, antepartum 09/12/2020  . Chronic hypertension affecting pregnancy   . History of preterm delivery, currently pregnant     Assessment/Plan:  Rishika Mccollom is a 35 y.o. [phone] at 45w4dhere for IOL for Chronic Hypertension.  #Labor:  Cytotec started at 0330.   #Pain: PRN, no Epidural #FWB: Cat 1, tracings difficult to obtain likely due to body habitus #ID: GBS neg #MOF: Breast #MOC: Post-Placental IUD #Circ: N/A #Chronic hypertension: BP WNL currently.  Continue to monitor. Continue ProCardia.  Obtain Pre-E labs. #Anxiety/Depression:  Continue Zoloft.  SW consult.   PDelora Fuel MD  04/11/2021, 4:32 AM  OB fellow attestation:  I have seen and examined this patient; I agree with above documentation in the resident's note.   SLashonne Shullis a 35y.o. [phone] for IOL due to cHTN  PE: BP (!) 101/57   Pulse 88   Temp 98.4 F (36.9 C) (Oral)   Resp 16   Ht _0  (1.753 m)   Wt (!) 152.4 kg   LMP 06/29/2020 (Within Days)   BMI 49.60 kg/m  Gen: calm comfortable, NAD Resp: normal effort, no distress Abd: gravid  ROS, labs, PMH reviewed  Plan: Admit to Labor and Delivery Continue home Procardia 67m dosing  Continue home Zoloft  Pre-e labs neg; random glucose 92 Plan cytotec dosing to start- pt hoping to avoid Pitocin- will plan for AROM when able Anticipate vag del Consent signed for postplacental [place]/06/2021, 8:39 AM

## 2021-04-12 MED ORDER — CALCIUM CARBONATE ANTACID 500 MG PO CHEW
1.0000 | CHEWABLE_TABLET | ORAL | Status: DC | PRN
  Administered 2021-04-12 (×2): 200 mg via ORAL
  Filled 2021-04-12 (×2): qty 1

## 2021-04-12 MED ORDER — PANTOPRAZOLE SODIUM 40 MG PO TBEC
40.0000 mg | DELAYED_RELEASE_TABLET | Freq: Every day | ORAL | Status: DC
  Administered 2021-04-12 – 2021-04-13 (×2): 40 mg via ORAL
  Filled 2021-04-12 (×2): qty 1

## 2021-04-12 MED ORDER — OXYCODONE HCL 5 MG PO TABS
5.0000 mg | ORAL_TABLET | ORAL | Status: DC | PRN
  Administered 2021-04-12 – 2021-04-13 (×5): 5 mg via ORAL
  Filled 2021-04-12 (×5): qty 1

## 2021-04-12 NOTE — Progress Notes (Signed)
POSTPARTUM PROGRESS NOTE  Subjective: Jill Mullen is a 35 y.o. H1T0569 s/p SVD at [redacted]w[redacted]d.  She reports she doing well. No acute events overnight. She denies any problems with ambulating, voiding or po intake. Denies nausea or vomiting. She has  passed flatus. Pain is poorly controlled.  Lochia is moderate. She did end up receiving pitocin after delivery given persistent bleeding after methergine/txa. Bleeding improved significantly.   Objective: Blood pressure 122/71, pulse 70, temperature 98 F (36.7 C), temperature source Oral, resp. rate 18, height 5\' 9"  (1.753 m), weight (!) 152.4 kg, last menstrual period 06/29/2020, SpO2 99 %, unknown if currently breastfeeding.  Physical Exam:  General: alert, cooperative and no distress Chest: no respiratory distress Abdomen: soft, appropriately-tender  Uterine Fundus: firm and at level of umbilicus Extremities: No calf swelling or tenderness   edema  Recent Labs    04/11/21 0245  HGB 11.5*  HCT 34.5*    Assessment/Plan: Jill Mullen is a 35 y.o. 32 s/p SVD at [redacted]w[redacted]d. PPD#1  Routine Postpartum Care: Doing well, pain well-controlled.  -- Continue routine care, lactation support  -- Chronic HTN: Continue procardia 60mg . BP normotensive.  -- Contraception: OP Liletta IUD  -- Feeding: breast   Dispo: Plan for discharge PPD2.  [redacted]w[redacted]d, MD OB Fellow, Faculty Practice 04/12/2021 7:33 AM

## 2021-04-12 NOTE — Progress Notes (Signed)
At 0930 pt. Complained of pressure in her rectum; upon exam it looked to be a cytotec.  MD notified who gave instructions to remove it; patient declined.  RN suggested that she could also try to remove it herself but she declined.  Later in the day at approx. 1730 patient passed a clot into the toilet and upon examining the clot which was golf ball size, there were two partially dissolved cytotecs visualized.  Patient states that she feels some relief.

## 2021-04-12 NOTE — Progress Notes (Signed)
MOB was referred for history of depression/anxiety. * Referral screened out by Clinical Social Worker because none of the following criteria appear to apply: ~ History of anxiety/depression during this pregnancy, or of post-partum depression following prior delivery. ~ Diagnosis of anxiety and/or depression within last 3 years OR * MOB's symptoms currently being treated with medication and/or therapy. MOB Zoloft has been managing MOB's symptoms. MOB also meet with Integrated Behavioral Health Specialist at [facility].  Please contact the Clinical Social Worker if needs arise, by MOB request, or if MOB scores greater than 9/yes to question 10 on Edinburgh Postpartum Depression Screen.  Jamarri Vuncannon Boyd-Gilyard, MSW, LCSW Clinical Social Work [phone] 

## 2021-04-13 ENCOUNTER — Other Ambulatory Visit (HOSPITAL_COMMUNITY): Payer: Self-pay

## 2021-04-13 MED ORDER — COCONUT OIL OIL: 1.0000 "application " | TOPICAL_OIL | 0 refills | Status: AC | PRN

## 2021-04-13 MED ORDER — NIFEDIPINE ER OSMOTIC RELEASE 60 MG PO TB24
60.0000 mg | ORAL_TABLET | Freq: Every day | ORAL | 1 refills | Status: DC
Start: 2021-04-13 — End: 2021-04-13
  Filled 2021-04-13: qty 30, 30d supply, fill #0

## 2021-04-13 MED ORDER — NIFEDIPINE ER 60 MG PO TB24
60.0000 mg | ORAL_TABLET | Freq: Every day | ORAL | 1 refills | Status: DC
  Filled 2021-04-13: qty 30, 30d supply, fill #0

## 2021-04-13 MED ORDER — OMEPRAZOLE 40 MG PO CPDR
40.0000 mg | DELAYED_RELEASE_CAPSULE | Freq: Every day | ORAL | 0 refills | Status: AC
  Filled 2021-04-13: qty 30, 30d supply, fill #0

## 2021-04-13 MED ORDER — IBUPROFEN 600 MG PO TABS
600.0000 mg | ORAL_TABLET | Freq: Three times a day (TID) | ORAL | 0 refills | Status: AC | PRN
  Filled 2021-04-13: qty 30, 10d supply, fill #0

## 2021-04-13 MED ORDER — ACETAMINOPHEN 500 MG PO TABS
1000.0000 mg | ORAL_TABLET | Freq: Four times a day (QID) | ORAL | Status: DC
  Administered 2021-04-13: 1000 mg via ORAL
  Filled 2021-04-13: qty 2

## 2021-04-13 MED ORDER — ACETAMINOPHEN 500 MG PO TABS
1000.0000 mg | ORAL_TABLET | Freq: Four times a day (QID) | ORAL | 0 refills | Status: AC
  Filled 2021-04-13: qty 60, 8d supply, fill #0

## 2021-04-13 MED ORDER — DOCUSATE SODIUM 100 MG PO CAPS
100.0000 mg | ORAL_CAPSULE | Freq: Two times a day (BID) | ORAL | 0 refills | Status: AC
  Filled 2021-04-13: qty 60, 30d supply, fill #0

## 2021-04-13 MED ORDER — OXYCODONE HCL 5 MG PO TABS
5.0000 mg | ORAL_TABLET | Freq: Four times a day (QID) | ORAL | 0 refills | Status: AC | PRN
  Filled 2021-04-13: qty 10, 3d supply, fill #0

## 2021-04-13 NOTE — Progress Notes (Signed)
PT states pain is unchanged at 9/10 after tylenol and oxy.  PT face appears calm, dressing infant.  RN spoke with pt about discussing pain control with provider before discharge.  Pt declines, states she thinks "laying down and getting comfortable will help."

## 2021-04-13 NOTE — Discharge Instructions (Signed)

## 2021-04-17 ENCOUNTER — Ambulatory Visit: Payer: Medicaid Other

## 2021-05-19 ENCOUNTER — Ambulatory Visit (HOSPITAL_COMMUNITY): Payer: Medicaid Other | Admitting: Clinical

## 2021-05-20 ENCOUNTER — Ambulatory Visit: Payer: Medicaid Other | Admitting: Family Medicine

## 2021-05-20 ENCOUNTER — Encounter: Payer: Self-pay | Admitting: Family Medicine

## 2021-05-20 NOTE — Progress Notes (Signed)
Patient did not keep appointment today. She will be called to reschedule.  

## 2021-06-04 ENCOUNTER — Other Ambulatory Visit: Payer: Self-pay

## 2021-06-04 ENCOUNTER — Ambulatory Visit (HOSPITAL_COMMUNITY): Payer: Medicaid Other | Admitting: Clinical

## 2021-06-30 ENCOUNTER — Encounter (HOSPITAL_COMMUNITY): Payer: Medicaid Other | Admitting: Psychiatry

## 2021-07-16 ENCOUNTER — Other Ambulatory Visit: Payer: Self-pay | Admitting: Obstetrics and Gynecology

## 2021-07-16 DIAGNOSIS — O10919 Unspecified pre-existing hypertension complicating pregnancy, unspecified trimester: Secondary | ICD-10-CM

## 2021-07-17 NOTE — Telephone Encounter (Signed)
Pt needs to be seen for postpartum visit and blood pressure management

## 2021-08-05 ENCOUNTER — Ambulatory Visit: Payer: Self-pay | Admitting: Obstetrics and Gynecology

## 2021-08-16 ENCOUNTER — Other Ambulatory Visit (HOSPITAL_COMMUNITY): Payer: Self-pay | Admitting: Psychiatry

## 2021-08-16 DIAGNOSIS — F411 Generalized anxiety disorder: Secondary | ICD-10-CM

## 2021-08-16 DIAGNOSIS — F331 Major depressive disorder, recurrent, moderate: Secondary | ICD-10-CM

## 2021-08-31 ENCOUNTER — Ambulatory Visit (HOSPITAL_COMMUNITY): Payer: Medicaid Other | Admitting: Clinical

## 2022-10-04 IMAGING — US US FETAL BPP W/ NON-STRESS
1 series · 14 of 14 positions shown · non-contrast
Comparison: none

[Series 1: us fetal bpp w/ non-stress · 14 acquisitions, 14 frames shown]
[im 1/14]
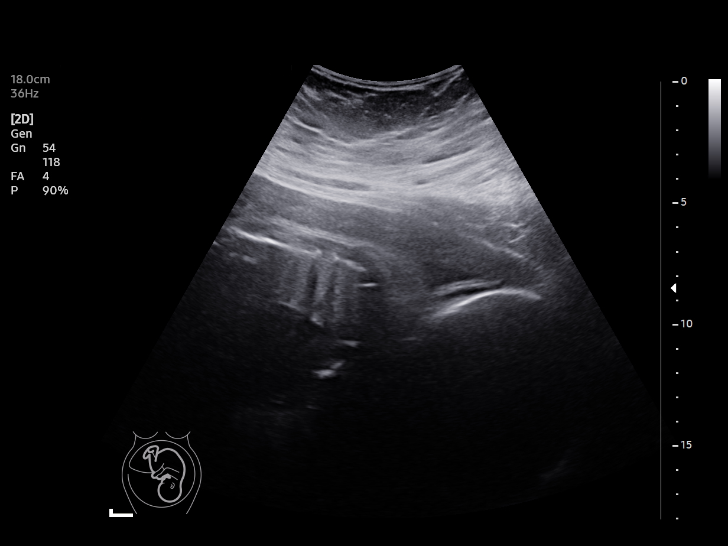
[im 2/14]
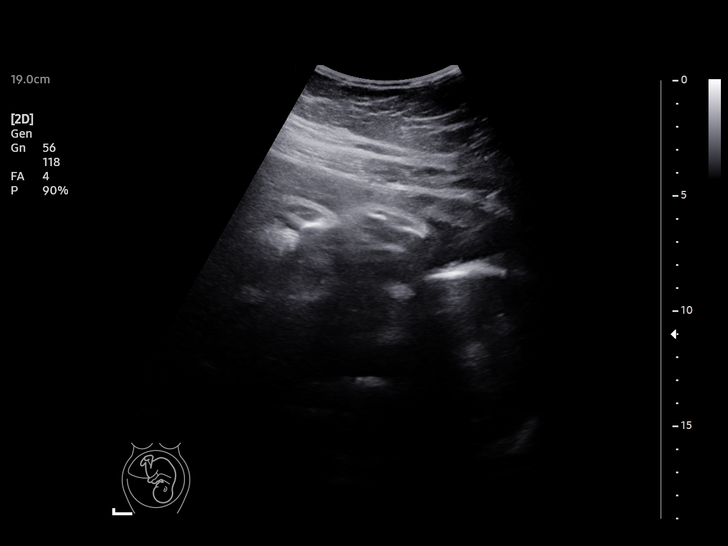
[im 3/14]
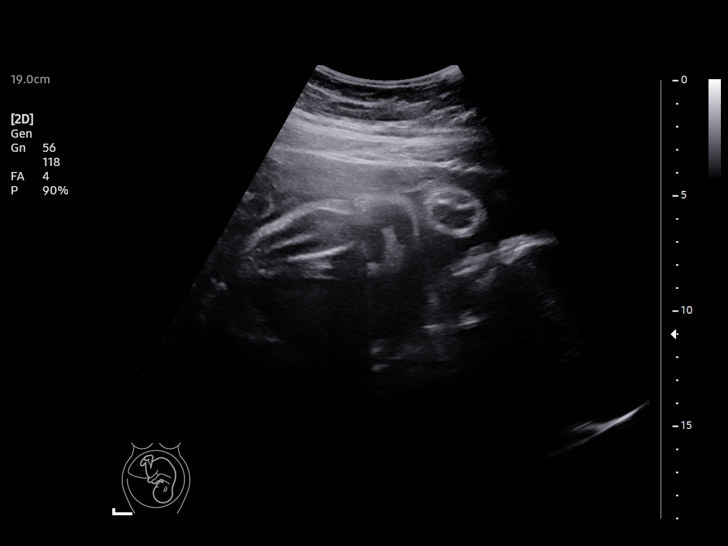
[im 4/14]
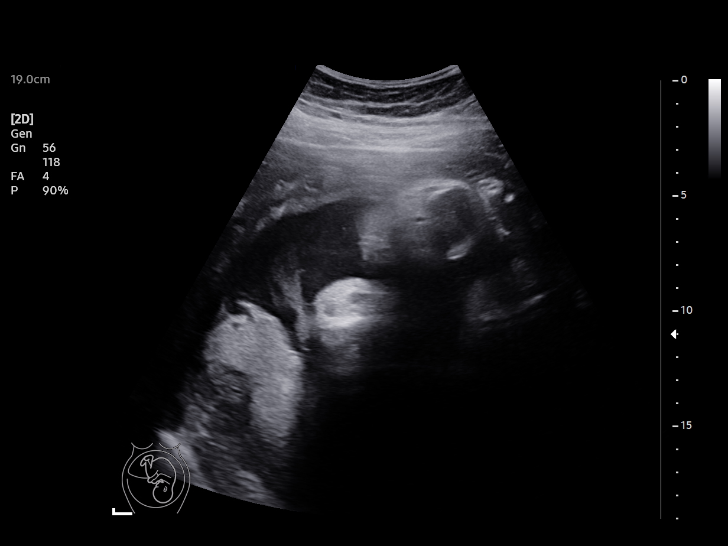
[im 5/14]
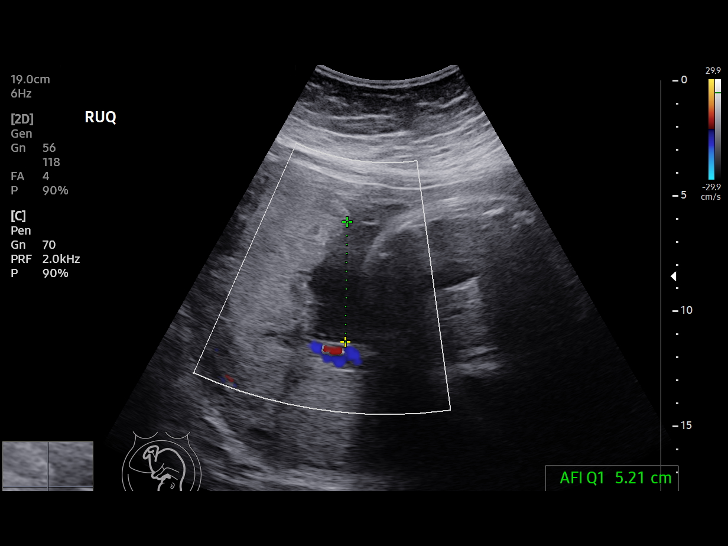
[im 6/14]
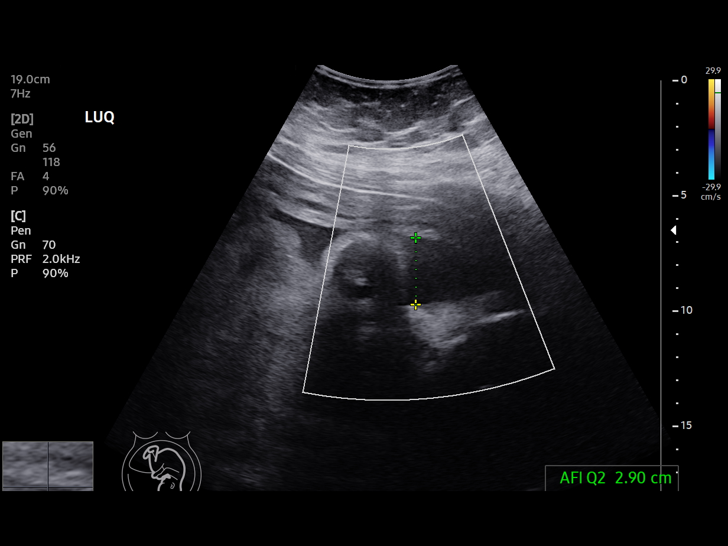
[im 7/14]
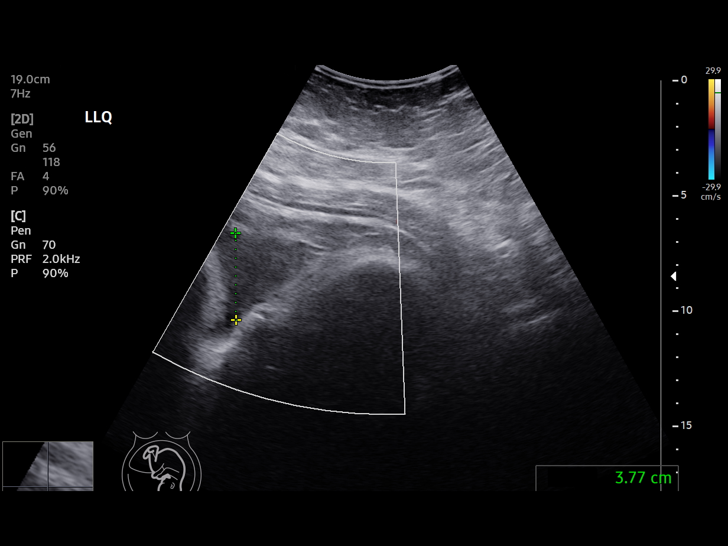
[im 8/14]
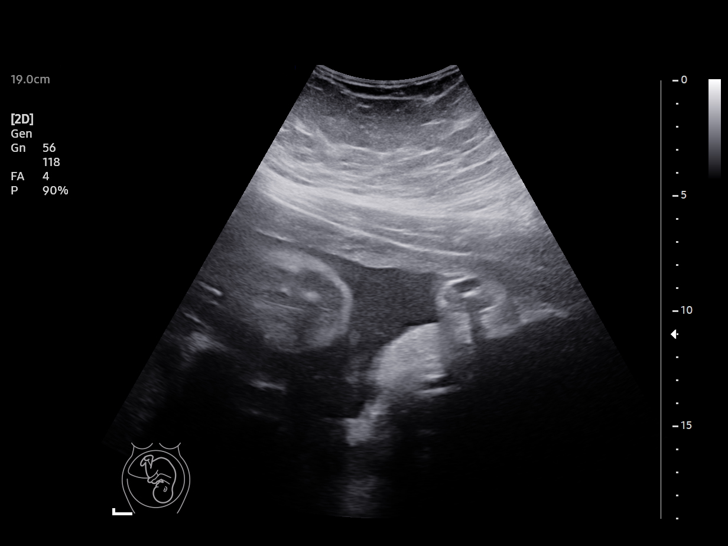
[im 9/14]
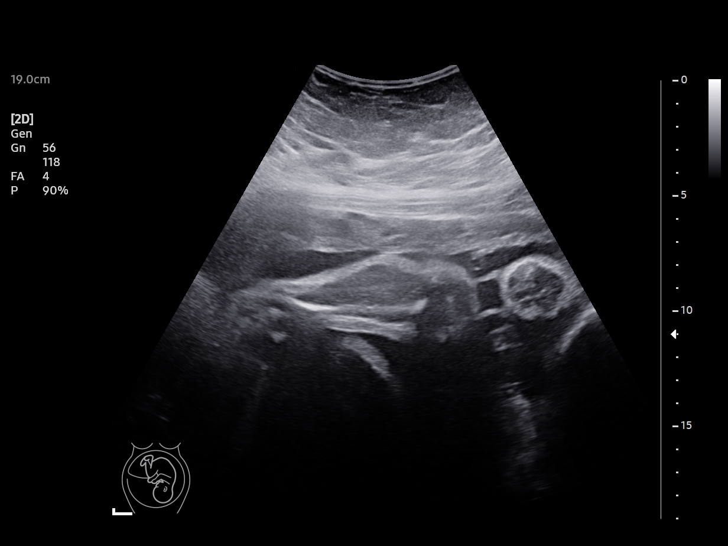
[im 10/14]
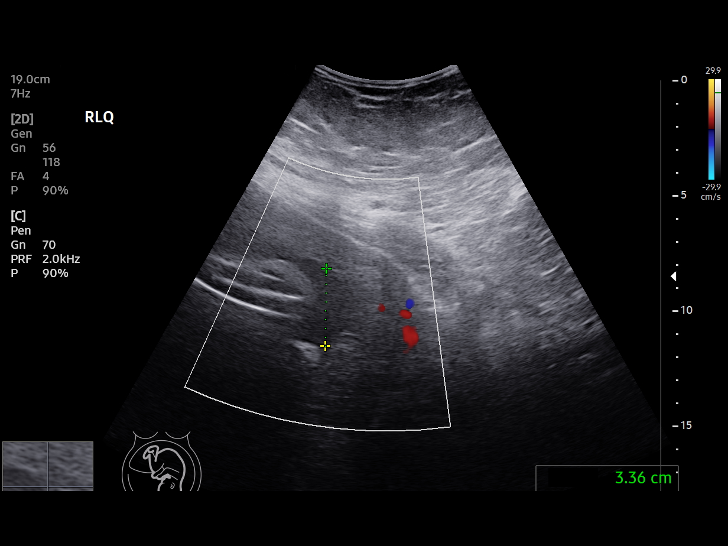
[im 11/14]
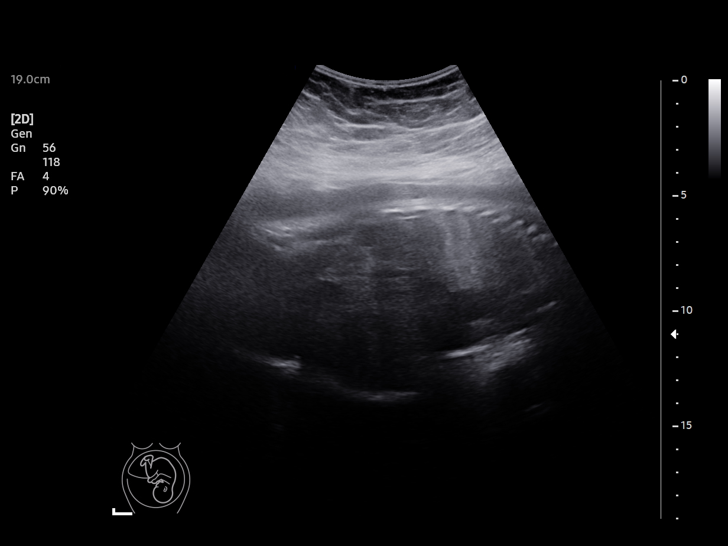
[im 12/14]
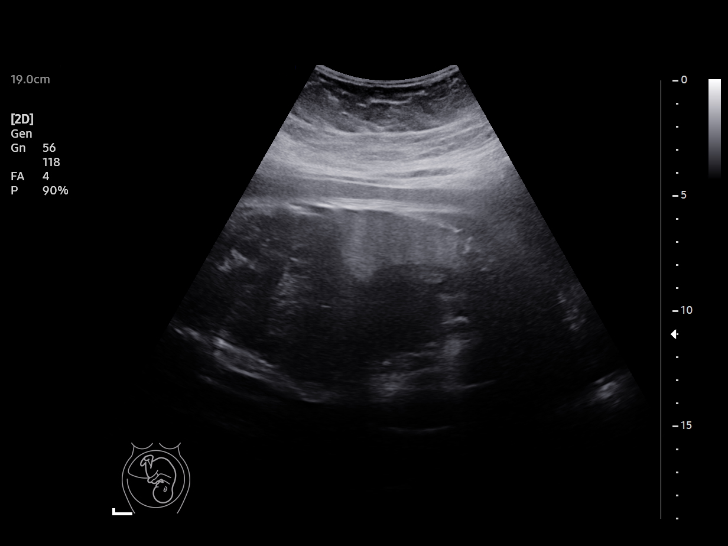
[im 13/14]
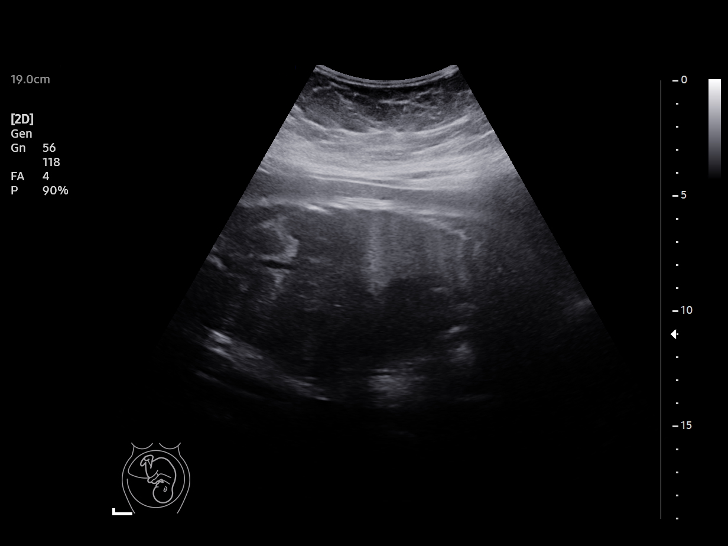
[im 14/14]
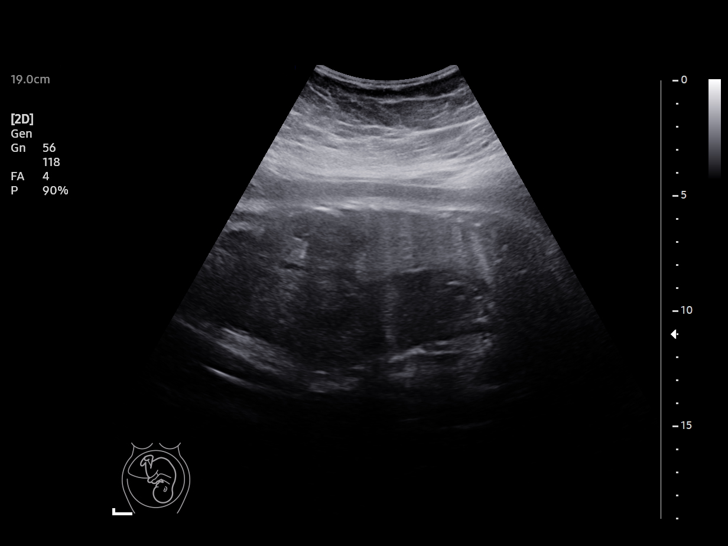

[14 of 14 positions shown; findings below may reference images not displayed]

[REDACTED]care at

Service(s) Provided

Indications

 33 weeks gestation of pregnancy
 Hypertension - Chronic/Pre-existing
Vital Signs

                                                Height:        5'9"
Fetal Evaluation

 Num Of Fetuses:         1
 Preg. Location:         Intrauterine
 Cardiac Activity:       Observed
 Presentation:           Cephalic

 Amniotic Fluid
 AFI FV:      Within normal limits

 AFI Sum(cm)     %Tile       Largest Pocket(cm)
 15.3            54

 RUQ(cm)       RLQ(cm)       LUQ(cm)        LLQ(cm)

Biophysical Evaluation

 Amniotic F.V:   Pocket => 2 cm             F. Tone:        Observed
 F. Movement:    Observed                   N.S.T:          Reactive
 F. Breathing:   Observed                   Score:          [DATE]
OB History

 Gravidity:    5         Term:   2        Prem:   1
 Living:       3
Gestational Age

 Clinical EDD:  35w 3d                                        EDD:   04/05/21
 Best:          33w 1d     Det. By:  U/S  (11/10/20)          EDD:   04/21/21
Impression

Recommendations

 Continue antenatal testing
                  Marilin Tri, DO
